# Patient Record
Sex: Male | Born: 1950 | Race: White | Hispanic: No | Marital: Married | Smoking: Never smoker
Health system: Southern US, Community
[De-identification: ages and names within clinical notes are randomized; demographics above are authoritative.]

## PROBLEM LIST (undated history)

## (undated) DIAGNOSIS — E119 Type 2 diabetes mellitus without complications: Secondary | ICD-10-CM

## (undated) DIAGNOSIS — I10 Essential (primary) hypertension: Secondary | ICD-10-CM

## (undated) DIAGNOSIS — I252 Old myocardial infarction: Secondary | ICD-10-CM

## (undated) HISTORY — PX: HERNIA REPAIR: SHX51

---

## 2015-09-13 ENCOUNTER — Emergency Department (HOSPITAL_BASED_OUTPATIENT_CLINIC_OR_DEPARTMENT_OTHER)
Admission: EM | Admit: 2015-09-13 | Discharge: 2015-09-13 | Disposition: A | Discharge status: Home / Self Care | Payer: Self-pay | Attending: Emergency Medicine | Admitting: Emergency Medicine

## 2015-09-13 ENCOUNTER — Encounter (HOSPITAL_BASED_OUTPATIENT_CLINIC_OR_DEPARTMENT_OTHER): Payer: Self-pay | Admitting: *Deleted

## 2015-09-13 ENCOUNTER — Emergency Department (HOSPITAL_BASED_OUTPATIENT_CLINIC_OR_DEPARTMENT_OTHER): Payer: Self-pay

## 2015-09-13 DIAGNOSIS — Y9259 Other trade areas as the place of occurrence of the external cause: Secondary | ICD-10-CM | POA: Insufficient documentation

## 2015-09-13 DIAGNOSIS — E119 Type 2 diabetes mellitus without complications: Secondary | ICD-10-CM | POA: Insufficient documentation

## 2015-09-13 DIAGNOSIS — Y9389 Activity, other specified: Secondary | ICD-10-CM | POA: Insufficient documentation

## 2015-09-13 DIAGNOSIS — S79911A Unspecified injury of right hip, initial encounter: Secondary | ICD-10-CM | POA: Insufficient documentation

## 2015-09-13 DIAGNOSIS — W010XXA Fall on same level from slipping, tripping and stumbling without subsequent striking against object, initial encounter: Secondary | ICD-10-CM | POA: Insufficient documentation

## 2015-09-13 DIAGNOSIS — S300XXA Contusion of lower back and pelvis, initial encounter: Secondary | ICD-10-CM | POA: Insufficient documentation

## 2015-09-13 DIAGNOSIS — I10 Essential (primary) hypertension: Secondary | ICD-10-CM | POA: Insufficient documentation

## 2015-09-13 DIAGNOSIS — Z794 Long term (current) use of insulin: Secondary | ICD-10-CM | POA: Insufficient documentation

## 2015-09-13 DIAGNOSIS — Y998 Other external cause status: Secondary | ICD-10-CM | POA: Insufficient documentation

## 2015-09-13 DIAGNOSIS — Z79899 Other long term (current) drug therapy: Secondary | ICD-10-CM | POA: Insufficient documentation

## 2015-09-13 HISTORY — DX: Type 2 diabetes mellitus without complications: E11.9

## 2015-09-13 HISTORY — DX: Essential (primary) hypertension: I10

## 2015-09-13 MED ORDER — TRAMADOL HCL 50 MG PO TABS: 50.0000 mg | ORAL_TABLET | Freq: Four times a day (QID) | ORAL | Status: AC | PRN

## 2015-09-13 MED ORDER — HYDROMORPHONE HCL 1 MG/ML IJ SOLN
1.0000 mg | Freq: Once | INTRAMUSCULAR | Status: AC
  Administered 2015-09-13: 1 mg via INTRAMUSCULAR
  Filled 2015-09-13: qty 1

## 2015-09-13 MED ORDER — KETOROLAC TROMETHAMINE 15 MG/ML IJ SOLN
15.0000 mg | Freq: Once | INTRAMUSCULAR | Status: AC
  Administered 2015-09-13: 15 mg via INTRAMUSCULAR
  Filled 2015-09-13: qty 1

## 2015-09-13 MED ORDER — DIAZEPAM 5 MG/ML IJ SOLN
5.0000 mg | Freq: Once | INTRAMUSCULAR | Status: AC
  Administered 2015-09-13: 5 mg via INTRAMUSCULAR
  Filled 2015-09-13: qty 2

## 2015-09-13 MED ORDER — CYCLOBENZAPRINE HCL 10 MG PO TABS: 10.0000 mg | ORAL_TABLET | Freq: Three times a day (TID) | ORAL | Status: AC | PRN

## 2015-09-13 MED ORDER — MORPHINE SULFATE (PF) 4 MG/ML IV SOLN
6.0000 mg | Freq: Once | INTRAVENOUS | Status: DC
Start: 2015-09-13 — End: 2015-09-13
  Filled 2015-09-13: qty 2

## 2015-09-13 NOTE — ED Notes (Signed)
Pt ambulated well with the walker with very little staff assistance.

## 2015-09-13 NOTE — ED Notes (Signed)
Pt ambulated to hallway and back to bed without difficulty with walker. Pt uses walker on a day to day basis at home. Tolerated well.

## 2015-09-13 NOTE — Discharge Instructions (Signed)

## 2015-09-13 NOTE — ED Notes (Signed)
C/o fall in laundry room of Hotel onto tailbone. C/o right hip, right shoulder and tailbone pain. No LOC.  Fell onto Community education officercement floor.

## 2015-09-13 NOTE — ED Notes (Signed)
[employer] called to transfer pt back to hotel. Staff do not have access to vehicle nor do they have authority to okay a staff member to transfer pt back to hotel.

## 2015-09-13 NOTE — ED Notes (Signed)
Dr Juleen ChinaKohut aware pt refused morphine.

## 2015-09-13 NOTE — ED Notes (Signed)
Talked with Terri RN charge and unit will give pt taxi voucher.

## 2015-09-24 ENCOUNTER — Encounter (HOSPITAL_COMMUNITY): Payer: Self-pay | Admitting: Emergency Medicine

## 2015-09-24 ENCOUNTER — Emergency Department (HOSPITAL_COMMUNITY)
Admission: EM | Admit: 2015-09-24 | Discharge: 2015-09-24 | Disposition: A | Discharge status: Home / Self Care | Payer: Self-pay | Attending: Emergency Medicine | Admitting: Emergency Medicine

## 2015-09-24 DIAGNOSIS — Y998 Other external cause status: Secondary | ICD-10-CM | POA: Insufficient documentation

## 2015-09-24 DIAGNOSIS — S3991XA Unspecified injury of abdomen, initial encounter: Secondary | ICD-10-CM | POA: Insufficient documentation

## 2015-09-24 DIAGNOSIS — E119 Type 2 diabetes mellitus without complications: Secondary | ICD-10-CM | POA: Insufficient documentation

## 2015-09-24 DIAGNOSIS — Y9289 Other specified places as the place of occurrence of the external cause: Secondary | ICD-10-CM | POA: Insufficient documentation

## 2015-09-24 DIAGNOSIS — W010XXA Fall on same level from slipping, tripping and stumbling without subsequent striking against object, initial encounter: Secondary | ICD-10-CM | POA: Insufficient documentation

## 2015-09-24 DIAGNOSIS — S8991XA Unspecified injury of right lower leg, initial encounter: Secondary | ICD-10-CM | POA: Insufficient documentation

## 2015-09-24 DIAGNOSIS — R1031 Right lower quadrant pain: Secondary | ICD-10-CM

## 2015-09-24 DIAGNOSIS — Z794 Long term (current) use of insulin: Secondary | ICD-10-CM | POA: Insufficient documentation

## 2015-09-24 DIAGNOSIS — Z79899 Other long term (current) drug therapy: Secondary | ICD-10-CM | POA: Insufficient documentation

## 2015-09-24 DIAGNOSIS — Y9389 Activity, other specified: Secondary | ICD-10-CM | POA: Insufficient documentation

## 2015-09-24 DIAGNOSIS — I10 Essential (primary) hypertension: Secondary | ICD-10-CM | POA: Insufficient documentation

## 2015-09-24 DIAGNOSIS — S79911A Unspecified injury of right hip, initial encounter: Secondary | ICD-10-CM | POA: Insufficient documentation

## 2015-09-24 MED ORDER — HYDROCODONE-ACETAMINOPHEN 5-325 MG PO TABS
1.0000 | ORAL_TABLET | Freq: Once | ORAL | Status: AC
  Administered 2015-09-24: 1 via ORAL
  Filled 2015-09-24: qty 1

## 2015-09-24 NOTE — ED Provider Notes (Signed)
History  By signing my name below, I, Timothy Walsh, attest that this documentation has been prepared under the direction and in the presence of [employer], PA-C. Electronically Signed: Karle Walsh, ED Scribe. 09/24/2015. 5:12 PM.  Chief Complaint  Patient presents with  . Fall  . Groin Pain   The history is provided by the patient and medical records. No language interpreter was used.    HPI Comments:  Timothy Walsh is a 65 y.o. male brought in by EMS, who presents to the Emergency Department complaining of a fall that occurred four days ago. He states he slipped on a wet spot in the floor and fell backwards on his back. He was taken to [facility] and received negative X-Rays. Pt reports receiving a prescription for pain medication but did not have it filled because it was too expensive. He states three days ago he started experiencing severe, burning, stabbing right groin pain after walking. He reports associated numbness of the right foot that is normal at baseline due to sciatica. He has been taking Tylenol 500 mg  Lying down intensifies the pain. Sitting up helps to relieve the pain. He denies abdominal pain, nausea, vomiting, fever or chills, urinary symptoms. Pt states he just moved here and does not have a local PCP.  Past Medical History  Diagnosis Date  . Hypertension   . Diabetes mellitus without complication (HCC)    History reviewed. No pertinent past surgical history. History reviewed. No pertinent family history. Social History  Substance Use Topics  . Smoking status: Never Smoker   . Smokeless tobacco: None  . Alcohol Use: None    Review of Systems A complete 10 system review of systems was obtained and all systems are negative except as noted in the HPI and PMH.   Allergies  Review of patient's allergies indicates no known allergies.  Home Medications   Prior to Admission medications   Medication Sig Start Date End Date Taking? Authorizing  Provider  acetaminophen (TYLENOL) 500 MG tablet Take 500 mg by mouth every 6 (six) hours as needed for moderate pain.   Yes Historical Provider, MD  atenolol (TENORMIN) 50 MG tablet Take 50 mg by mouth 2 (two) times daily.    Yes Historical Provider, MD  cyclobenzaprine (FLEXERIL) 10 MG tablet Take 1 tablet (10 mg total) by mouth 3 (three) times daily as needed for muscle spasms. 09/13/15   Raeford Razor, MD  insulin aspart (NOVOLOG) 100 UNIT/ML injection Inject 2 Units into the skin 3 (three) times daily before meals.    Historical Provider, MD  traMADol (ULTRAM) 50 MG tablet Take 1 tablet (50 mg total) by mouth every 6 (six) hours as needed. 09/13/15   Raeford Razor, MD   Triage Vitals: BP 168/109 mmHg  Pulse 100  Temp(Src) 97.6 F (36.4 C) (Oral)  Resp 18  SpO2 99% Physical Exam  Constitutional: He is oriented to person, place, and time. He appears well-developed and well-nourished. No distress.  HENT:  Head: Normocephalic and atraumatic.  Eyes: Conjunctivae and EOM are normal.  Neck: Normal range of motion.  Cardiovascular: Normal rate.   Pulmonary/Chest: Effort normal.  Abdominal: Soft. There is no tenderness. There is no rebound and no guarding.  Musculoskeletal: Normal range of motion. He exhibits tenderness.  Discomfort is replicated with right hip flexion. No hernia. No other swelling, erythema, warmth or other abnormalities.  Neurological: He is alert and oriented to person, place, and time.  Motor strength and sensation baseline.  Skin: Skin is warm and dry. He is not diaphoretic.  Psychiatric: He has a normal mood and affect. His behavior is normal.  Nursing note and vitals reviewed.   ED Course  Procedures (including critical care time) DIAGNOSTIC STUDIES: Oxygen Saturation is 99% on RA, normal by my interpretation.   COORDINATION OF CARE: 5:10 PM- Will prescribe a cheaper pain medication. Pt verbalizes understanding and agrees to plan.  Medications   HYDROcodone-acetaminophen (NORCO/VICODIN) 5-325 MG per tablet 1 tablet (1 tablet Oral Given 09/24/15 1734)    Labs Review Labs Reviewed - No data to display  Imaging Review No results found. I have personally reviewed and evaluated these images and lab results as part of my medical decision-making.   EKG Interpretation None     Meds given in ED:  Medications  HYDROcodone-acetaminophen (NORCO/VICODIN) 5-325 MG per tablet 1 tablet (1 tablet Oral Given 09/24/15 1734)    New Prescriptions   No medications on file   Filed Vitals:   09/24/15 1558 09/24/15 1638 09/24/15 1815  BP:  168/109 152/109  Pulse:  100 92  Temp:  97.6 F (36.4 C)   TempSrc:  Oral   Resp:  18 20  SpO2: 95% 99% 96%    MDM  Demarrio Menges is a 65 y.o. male who presents for evaluation of right leg and hip pain. Upon review of old notes, patient had x-rays done on 1/13 of bilateral hips, sacrum and lumbar spine which were all negative. He was discharged with a prescription for Flexeril and tramadol, which he did not fill stating it was too expensive (said $62). On exam today, pain is reproduced with right hip flexion. There is no evidence of hernia or other emergent pathology. Suspect musculoskeletal injury. Pain resolves with oral Norco in ED. No new injury or trauma, no further imaging indicated. Upon further investigation, both Flexeril tramadol are on the [facility] $4 list. Encouraged patient follow-up with [facility] pharmacy. He verbalizes understanding and agrees with this plan. Also given referral to community health and wellness in order to establish PCP care. Final diagnoses:  Right groin pain    I personally performed the services described in this documentation, which was scribed in my presence. The recorded information has been reviewed and is accurate.     Joycie Peek, PA-C 09/24/15 1835  Melene Plan, DO 09/24/15 2330

## 2015-09-24 NOTE — Discharge Instructions (Signed)
Please fill your prescriptions that you received at your previous visit. These medications are more inexpensive at [facility]. Follow up with the community health and wellness Center to establish primary care. Return to ED for any new or worsening symptoms.

## 2015-09-24 NOTE — ED Notes (Addendum)
Per EMS, Friday he fell and landed on sacrum, was seen and x-rayed then. Did not get pain medication filled and has been having increasing pain in his groin and tailbone area. Hx of sciatica, low back and leg pain. Denies any swelling, redness, deformities near areas. Denies pain to genitals.

## 2015-09-24 NOTE — ED Notes (Signed)
Pt is waiting for Social Worker to assist with transportation

## 2015-09-25 NOTE — ED Provider Notes (Signed)
CSN: [id number]     Arrival date & time 09/13/15  4098 History   First MD Initiated Contact with Patient 09/13/15 [phone]     Chief Complaint  Patient presents with  . Fall  . Hip Pain     (Consider location/radiation/quality/duration/timing/severity/associated sxs/prior Treatment) HPI   65 year old male with lower back/buttock pain. Patient reports mechanical fall when he slipped on a wet surface at a local hotel just prior to arrival. He fell backwards onto his butt/back. He has had persistent lower back/sacral pain since then. Pain is worse with movement. No numbness or tingling. He is not sure if he hit his head or not. Denies any significant headaches or neck pain. No blood thinners.  Past Medical History  Diagnosis Date  . Hypertension   . Diabetes mellitus without complication (HCC)    History reviewed. No pertinent past surgical history. No family history on file. Social History  Substance Use Topics  . Smoking status: Never Smoker   . Smokeless tobacco: None  . Alcohol Use: None    Review of Systems  All systems reviewed and negative, other than as noted in HPI.   Allergies  Review of patient's allergies indicates no known allergies.  Home Medications   Prior to Admission medications   Medication Sig Start Date End Date Taking? Authorizing Provider  acetaminophen (TYLENOL) 500 MG tablet Take 500 mg by mouth every 6 (six) hours as needed for moderate pain.    Historical Provider, MD  atenolol (TENORMIN) 50 MG tablet Take 50 mg by mouth 2 (two) times daily.    Yes Historical Provider, MD  cyclobenzaprine (FLEXERIL) 10 MG tablet Take 1 tablet (10 mg total) by mouth 3 (three) times daily as needed for muscle spasms. 09/13/15   Raeford Razor, MD  insulin aspart (NOVOLOG) 100 UNIT/ML injection Inject 2 Units into the skin 3 (three) times daily before meals.   Yes Historical Provider, MD  traMADol (ULTRAM) 50 MG tablet Take 1 tablet (50 mg total) by mouth every 6 (six)  hours as needed. 09/13/15   Raeford Razor, MD   BP 171/105 mmHg  Pulse 81  Temp(Src) 98.2 F (36.8 C) (Oral)  Resp 18  Ht  (1.753 m)  Wt 221 lb (100.245 kg)  BMI 32.62 kg/m2  SpO2 98% Physical Exam  Constitutional: He appears well-developed and well-nourished. No distress.  HENT:  Head: Normocephalic and atraumatic.  Eyes: Conjunctivae are normal. Right eye exhibits no discharge. Left eye exhibits no discharge.  Neck: Neck supple.  Cardiovascular: Normal rate, regular rhythm and normal heart sounds.  Exam reveals no gallop and no friction rub.   No murmur heard. Pulmonary/Chest: Effort normal and breath sounds normal. No respiratory distress.  Abdominal: Soft. He exhibits no distension. There is no tenderness.  Musculoskeletal: He exhibits no edema or tenderness.  Tenderness to palpation in the lower lumbar region and sacrum. No overlying skin changes. Lower extremity*surgery as compared to each other. No shortening or malrotation. He is neurovascularly intact. He has no bony tenderness of the lower extremities or pelvis. He does report increased pain with range of motion of either hip though.  Neurological: He is alert.  Skin: Skin is warm and dry.  Psychiatric: He has a normal mood and affect. His behavior is normal. Thought content normal.  Nursing note and vitals reviewed.   ED Course  Procedures (including critical care time) Labs Review Labs Reviewed - No data to display  Imaging Review No results found. I have personally  reviewed and evaluated these images and lab results as part of my medical decision-making.   EKG Interpretation None      MDM   Final diagnoses:  Contusion of lower back, initial encounter    65 year old male with lower back/sacral pain after a mechanical fall this prior to arrival. Nonfocal neuro exam. Reassuring imaging. Likely contusion and plan symptomatic treatment.    Raeford Razor, MD 09/25/15 [phone]

## 2017-11-21 ENCOUNTER — Other Ambulatory Visit: Payer: Self-pay

## 2017-11-21 ENCOUNTER — Emergency Department (HOSPITAL_COMMUNITY)
Admission: EM | Admit: 2017-11-21 | Discharge: 2017-11-22 | Disposition: A | Discharge status: Home / Self Care | Payer: Self-pay | Attending: Emergency Medicine | Admitting: Emergency Medicine

## 2017-11-21 ENCOUNTER — Encounter (HOSPITAL_COMMUNITY): Payer: Self-pay

## 2017-11-21 DIAGNOSIS — I82401 Acute embolism and thrombosis of unspecified deep veins of right lower extremity: Secondary | ICD-10-CM | POA: Insufficient documentation

## 2017-11-21 DIAGNOSIS — R739 Hyperglycemia, unspecified: Secondary | ICD-10-CM

## 2017-11-21 DIAGNOSIS — E1165 Type 2 diabetes mellitus with hyperglycemia: Secondary | ICD-10-CM | POA: Insufficient documentation

## 2017-11-21 DIAGNOSIS — I10 Essential (primary) hypertension: Secondary | ICD-10-CM | POA: Insufficient documentation

## 2017-11-21 DIAGNOSIS — I252 Old myocardial infarction: Secondary | ICD-10-CM | POA: Insufficient documentation

## 2017-11-21 HISTORY — DX: Old myocardial infarction: I25.2

## 2017-11-21 LAB — CBC WITH DIFFERENTIAL/PLATELET
BASOS PCT: 0 %
Basophils Absolute: 0 10*3/uL (ref 0.0–0.1)
EOS ABS: 0.1 10*3/uL (ref 0.0–0.7)
Eosinophils Relative: 2 %
HEMATOCRIT: 39.3 % (ref 39.0–52.0)
HEMOGLOBIN: 13.1 g/dL (ref 13.0–17.0)
Lymphocytes Relative: 17 %
Lymphs Abs: 1.6 10*3/uL (ref 0.7–4.0)
MCH: 29.2 pg (ref 26.0–34.0)
MCHC: 33.3 g/dL (ref 30.0–36.0)
MCV: 87.7 fL (ref 78.0–100.0)
MONOS PCT: 10 %
Monocytes Absolute: 0.9 10*3/uL (ref 0.1–1.0)
NEUTROS ABS: 6.7 10*3/uL (ref 1.7–7.7)
NEUTROS PCT: 71 %
Platelets: 229 10*3/uL (ref 150–400)
RBC: 4.48 MIL/uL (ref 4.22–5.81)
RDW: 13.5 % (ref 11.5–15.5)
WBC: 9.4 10*3/uL (ref 4.0–10.5)

## 2017-11-21 LAB — BLOOD GAS, VENOUS
Acid-Base Excess: 0.6 mmol/L (ref 0.0–2.0)
Bicarbonate: 25.2 mmol/L (ref 20.0–28.0)
O2 SAT: 25 %
PATIENT TEMPERATURE: 98.6
PH VEN: 7.388 (ref 7.250–7.430)
pCO2, Ven: 42.8 mmHg — ABNORMAL LOW (ref 44.0–60.0)

## 2017-11-21 LAB — CBG MONITORING, ED: Glucose-Capillary: 512 mg/dL (ref 65–99)

## 2017-11-21 LAB — URINALYSIS, ROUTINE W REFLEX MICROSCOPIC
BACTERIA UA: NONE SEEN
Bilirubin Urine: NEGATIVE
Ketones, ur: NEGATIVE mg/dL
LEUKOCYTES UA: NEGATIVE
NITRITE: NEGATIVE
PROTEIN: NEGATIVE mg/dL
Specific Gravity, Urine: 1.02 (ref 1.005–1.030)
pH: 5 (ref 5.0–8.0)

## 2017-11-21 LAB — PROTIME-INR
INR: 0.92
PROTHROMBIN TIME: 12.3 s (ref 11.4–15.2)

## 2017-11-21 NOTE — ED Notes (Signed)
Bed: [id number] Expected date:  Expected time:  Means of arrival:  Comments: 68 m leg pain/hyperglycemia

## 2017-11-21 NOTE — ED Triage Notes (Signed)
Right leg pain for 3 days and blood sugar 561 states pain 9/10.

## 2017-11-21 NOTE — ED Provider Notes (Signed)
[facility]-EMERGENCY DEPT Provider Note   CSN: [id number] Arrival date & time: 11/21/17  2155     History   Chief Complaint Chief Complaint  Patient presents with  . Leg Pain    HPI Timothy Walsh is a 67 y.o. male with a hx of IDDM, HTN, MI presents to the Emergency Department complaining of gradual, persistent, progressively worsening right leg pain onset 3 days ago. Associated symptoms include of the right lower extremity.  Patient denies recent surgery, fracture, immobilization, history of DVT.  Patient reports pain is significantly increased with palpation and walking.  He denies fevers or chills, open wounds.  Nothing seems to make the symptoms better.  Additionally, patient reports hyperglycemia tonight.  He reports that when the ambulance checked his blood sugar it was greater than 500.  He reports he is not taking insulin but is only taking metformin.  He states his blood sugar normally runs 200-300 at home.  Patient denies fever, chills, headache, neck pain, chest pain, shortness of breath, abdominal pain, nausea, vomiting, diarrhea, weakness, dizziness, syncope.  He does endorse polydipsia and polyuria.  The history is provided by the patient, medical records and the spouse. No language interpreter was used.    Past Medical History:  Diagnosis Date  . Diabetes mellitus without complication (HCC)   . Hypertension   . MI, old     There are no active problems to display for this patient.   History reviewed. No pertinent surgical history.      Home Medications    Prior to Admission medications   Medication Sig Start Date End Date Taking? Authorizing Provider  cyclobenzaprine (FLEXERIL) 10 MG tablet Take 1 tablet (10 mg total) by mouth 3 (three) times daily as needed for muscle spasms. 09/13/15  Yes Raeford RazorKohut, Stephen, MD  ibuprofen (ADVIL,MOTRIN) 200 MG tablet Take 200 mg by mouth every 6 (six) hours as needed for headache, mild pain or moderate pain.    Yes [provider]  traMADol (ULTRAM) 50 MG tablet Take 1 tablet (50 mg total) by mouth every 6 (six) hours as needed. Patient not taking: Reported on 11/22/2017 09/13/15   Raeford RazorKohut, Stephen, MD    Family History History reviewed. No pertinent family history.  Social History Social History   Tobacco Use  . Smoking status: Never Smoker  . Smokeless tobacco: Never Used  Substance Use Topics  . Alcohol use: Not on file  . Drug use: Not on file     Allergies   Patient has no known allergies.   Review of Systems Review of Systems  Constitutional: Negative for appetite change, diaphoresis, fatigue, fever and unexpected weight change.  HENT: Negative for mouth sores.   Eyes: Negative for visual disturbance.  Respiratory: Negative for cough, chest tightness, shortness of breath and wheezing.   Cardiovascular: Positive for leg swelling. Negative for chest pain.  Gastrointestinal: Negative for abdominal pain, constipation, diarrhea, nausea and vomiting.  Endocrine: Positive for polydipsia and polyuria. Negative for polyphagia.  Genitourinary: Negative for dysuria, frequency, hematuria and urgency.  Musculoskeletal: Positive for arthralgias. Negative for back pain and neck stiffness.  Skin: Positive for color change. Negative for rash.  Allergic/Immunologic: Negative for immunocompromised state.  Neurological: Negative for syncope, light-headedness and headaches.  Hematological: Does not bruise/bleed easily.  Psychiatric/Behavioral: Negative for sleep disturbance. The patient is not nervous/anxious.      Physical Exam Updated Vital Signs BP (!) 163/85 (BP Location: Left Arm)   Pulse 91   Temp 97.7  F (36.5 C) (Oral)   Resp 18   Ht 6' (1.829 m)   Wt 100.2 kg (221 lb)   SpO2 98%   BMI 29.97 kg/m   Physical Exam  Constitutional: He appears well-developed and well-nourished. No distress.  Awake, alert, nontoxic appearance  HENT:  Head: Normocephalic and atraumatic.    Mouth/Throat: Oropharynx is clear and moist. Mucous membranes are dry. No oropharyngeal exudate.  Mucous membranes are dry  Eyes: Conjunctivae are normal. No scleral icterus.  Neck: Normal range of motion. Neck supple.  Cardiovascular: Normal rate, regular rhythm and intact distal pulses.  Pulmonary/Chest: Effort normal and breath sounds normal. No respiratory distress. He has no wheezes.  Equal chest expansion  Abdominal: Soft. Bowel sounds are normal. He exhibits no mass. There is no tenderness. There is no rebound and no guarding.  Musculoskeletal: Normal range of motion. He exhibits no edema.  Right lower extremity with significant tenderness to palpation along the right posterior calf.  Entire right lower extremity is more edematous than left lower extremity.  No significant increased warmth, induration or fluctuance to suggest cellulitis.  Neurological: He is alert.  Speech is clear and goal oriented Moves extremities without ataxia  Skin: Skin is warm and dry. He is not diaphoretic.  Psychiatric: He has a normal mood and affect.  Nursing note and vitals reviewed.    ED Treatments / Results  Labs (all labs ordered are listed, but only abnormal results are displayed) Labs Reviewed  COMPREHENSIVE METABOLIC PANEL - Abnormal; Notable for the following components:      Result Value   Sodium 133 (*)    Chloride 98 (*)    Glucose, Bld 508 (*)    BUN 29 (*)    Creatinine, Ser 1.49 (*)    AST 14 (*)    ALT 16 (*)    GFR calc non Af Amer 48 (*)    GFR calc Af Amer 55 (*)    All other components within normal limits  URINALYSIS, ROUTINE W REFLEX MICROSCOPIC - Abnormal; Notable for the following components:   Color, Urine STRAW (*)    Glucose, UA >=500 (*)    Hgb urine dipstick SMALL (*)    Squamous Epithelial / LPF 0-5 (*)    All other components within normal limits  BLOOD GAS, VENOUS - Abnormal; Notable for the following components:   pCO2, Ven 42.8 (*)    All other  components within normal limits  CBG MONITORING, ED - Abnormal; Notable for the following components:   Glucose-Capillary 512 (*)    All other components within normal limits  CBG MONITORING, ED - Abnormal; Notable for the following components:   Glucose-Capillary 431 (*)    All other components within normal limits  CBG MONITORING, ED - Abnormal; Notable for the following components:   Glucose-Capillary 429 (*)    All other components within normal limits  CBG MONITORING, ED - Abnormal; Notable for the following components:   Glucose-Capillary 356 (*)    All other components within normal limits  CBC WITH DIFFERENTIAL/PLATELET  PROTIME-INR    EKG EKG Interpretation  Date/Time:  Sunday November 21 2017 23:26:00 EDT Ventricular Rate:  87 PR Interval:    QRS Duration: 132 QT Interval:  387 QTC Calculation: 466 R Axis:   69 Text Interpretation:  Sinus rhythm Right bundle branch block No old tracing to compare Confirmed by Melene Plan [phone]) on 11/21/2017 11:29:25 PM   Procedures Procedures (including critical care time)  Medications  Ordered in ED Medications  sodium chloride 0.9 % bolus 1,000 mL (0 mLs Intravenous Stopped 11/22/17 0239)  sodium chloride 0.9 % bolus 500 mL (0 mLs Intravenous Stopped 11/22/17 0318)  enoxaparin (LOVENOX) injection 100 mg (100 mg Subcutaneous Given 11/22/17 0301)  sodium chloride 0.9 % bolus 500 mL (0 mLs Intravenous Stopped 11/22/17 0355)  HYDROcodone-acetaminophen (NORCO/VICODIN) 5-325 MG per tablet 1 tablet (1 tablet Oral Given 11/22/17 0319)     Initial Impression / Assessment and Plan / ED Course  I have reviewed the triage vital signs and the nursing notes.  Pertinent labs & imaging results that were available during my care of the patient were reviewed by me and considered in my medical decision making (see chart for details).  Clinical Course as of Nov 23 623  Wynelle Link Nov 21, 2017  2345 Glucose(!!): 508 [HM]  2345 No ketones in his urine    Ketones, ur: NEGATIVE [HM]  2345 Elevated, unknown baseline.  Creatinine(!): 1.49 [HM]  2345 PH within normal limits  pH, Ven: 7.388 [HM]  2345 No leukocytosis  WBC: 9.4 [HM]  Mon Nov 22, 2017  0130 Somewhat improved blood sugar.  Fluids running at this time.  Glucose-Capillary(!): 431 [HM]  0220 Pt hypertensive.  Denies chest pain or shortness of breath.  BP(!): 171/96 [HM]  0541 Blood sugar has improved.  Glucose-Capillary(!): 356 [HM]    Clinical Course User Index [HM] Zahari Fazzino, Dahlia Client, PA-C    Patient presents with hyperglycemia and concern for possible right lower extremity DVT.  Patient with hyperglycemia.  No evidence of DKA.  He is hypertensive but has no pain or shortness of breath.  Serum creatinine is elevated however unknown baseline.  No ketones in his urine.  Patient is receiving fluids at this time.  He will need venous duplex in the a.m. patient given Lovenox here in the emergency department.  5:47 AM Patient's blood sugar has decreased.  No evidence of DKA.  No emesis.  He denies chest pain or shortness of breath.  No hypoxia.  No tachycardia.  No hemoptysis.  He will receive venous duplex in the a.m.  6:25 AM At shift change care was transferred to Timothy Robinson, PA-C who will follow pending studies, re-evaulate and determine disposition.     Final Clinical Impressions(s) / ED Diagnoses   Final diagnoses:  Hyperglycemia  Right leg pain    ED Discharge Orders    None       Derel Mcglasson, Boyd Kerbs 11/22/17 5366    Melene Plan, DO 11/30/17 1512

## 2017-11-22 ENCOUNTER — Emergency Department (HOSPITAL_BASED_OUTPATIENT_CLINIC_OR_DEPARTMENT_OTHER)
Admit: 2017-11-22 | Discharge: 2017-11-22 | Disposition: A | Discharge status: Home / Self Care | Payer: Self-pay | Attending: Emergency Medicine | Admitting: Emergency Medicine

## 2017-11-22 ENCOUNTER — Telehealth: Payer: Self-pay | Admitting: Emergency Medicine

## 2017-11-22 DIAGNOSIS — R609 Edema, unspecified: Secondary | ICD-10-CM

## 2017-11-22 LAB — CBG MONITORING, ED
GLUCOSE-CAPILLARY: 431 mg/dL — AB (ref 65–99)
Glucose-Capillary: 429 mg/dL — ABNORMAL HIGH (ref 65–99)
Glucose-Capillary: 356 mg/dL — ABNORMAL HIGH (ref 65–99)

## 2017-11-22 LAB — COMPREHENSIVE METABOLIC PANEL
ALBUMIN: 3.5 g/dL (ref 3.5–5.0)
ALT: 16 U/L — AB (ref 17–63)
AST: 14 U/L — AB (ref 15–41)
Alkaline Phosphatase: 81 U/L (ref 38–126)
Anion gap: 12 (ref 5–15)
BUN: 29 mg/dL — AB (ref 6–20)
CHLORIDE: 98 mmol/L — AB (ref 101–111)
CO2: 23 mmol/L (ref 22–32)
Calcium: 9.2 mg/dL (ref 8.9–10.3)
Creatinine, Ser: 1.49 mg/dL — ABNORMAL HIGH (ref 0.61–1.24)
GFR calc Af Amer: 55 mL/min — ABNORMAL LOW (ref >60)
GFR, EST NON AFRICAN AMERICAN: 48 mL/min — AB (ref >60)
Glucose, Bld: 508 mg/dL (ref 65–99)
POTASSIUM: 3.8 mmol/L (ref 3.5–5.1)
SODIUM: 133 mmol/L — AB (ref 135–145)
Total Bilirubin: 0.4 mg/dL (ref 0.3–1.2)
Total Protein: 7.6 g/dL (ref 6.5–8.1)

## 2017-11-22 MED ORDER — RIVAROXABAN (XARELTO) VTE STARTER PACK (15 & 20 MG): ORAL_TABLET | ORAL | 0 refills | Status: DC

## 2017-11-22 MED ORDER — ENOXAPARIN SODIUM 100 MG/ML ~~LOC~~ SOLN
1.0000 mg/kg | Freq: Once | SUBCUTANEOUS | Status: AC
  Administered 2017-11-22: 100 mg via SUBCUTANEOUS
  Filled 2017-11-22: qty 1

## 2017-11-22 MED ORDER — SODIUM CHLORIDE 0.9 % IV BOLUS (SEPSIS)
1000.0000 mL | Freq: Once | INTRAVENOUS | Status: AC
  Administered 2017-11-22: 1000 mL via INTRAVENOUS

## 2017-11-22 MED ORDER — SODIUM CHLORIDE 0.9 % IV BOLUS (SEPSIS)
500.0000 mL | Freq: Once | INTRAVENOUS | Status: AC
  Administered 2017-11-22: 500 mL via INTRAVENOUS

## 2017-11-22 MED ORDER — SODIUM CHLORIDE 0.9 % IV BOLUS (SEPSIS)
500.0000 mL | Freq: Once | INTRAVENOUS | Status: AC
Start: 2017-11-22 — End: 2017-11-22
  Administered 2017-11-22: 500 mL via INTRAVENOUS

## 2017-11-22 MED ORDER — HYDROCODONE-ACETAMINOPHEN 5-325 MG PO TABS
1.0000 | ORAL_TABLET | Freq: Once | ORAL | Status: AC
  Administered 2017-11-22: 1 via ORAL
  Filled 2017-11-22: qty 1

## 2017-11-22 MED ORDER — METFORMIN HCL 500 MG PO TABS: 500.0000 mg | ORAL_TABLET | Freq: Two times a day (BID) | ORAL | 0 refills | Status: DC

## 2017-11-22 NOTE — ED Notes (Signed)
Vascular at the bedside. 

## 2017-11-22 NOTE — ED Notes (Signed)
Condom cath placed due to pt urinary frequency

## 2017-11-22 NOTE — Discharge Instructions (Signed)
Please read instructions below. Begin taking the xarelto every 12 hours, as directed. Schedule an appointment with the vascular specialist, Dr. Arbie CookeyEarly, to follow up on your blood clot treatment. You can take tylenol as needed for pain. Establish primary care. Take your metformin as prescribed. Drink plenty of water. Return to the ER for shortness of breath, chest pain, coughing up blood, or new or concerning symptoms.

## 2017-11-22 NOTE — Progress Notes (Signed)
Right lower extremity venous duplex has been completed. 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 There is also evidence of age indeterminate superficial vein thrombosis involving the lesser saphenous vein of the right lower extremity. Results were given to [place] Robinson PA.  11/22/17 8:52 AM Olen CordialGreg Ardian Haberland RVT

## 2017-11-22 NOTE — Telephone Encounter (Signed)
CM contacted by [place] EDPA regarding the pt who was previously D/C with a new prescription for Xarelto.  Pt had called in [facility].  CM received the pharmacy contact information, [phone].  Advised them that if the 30-day free trial card does not work that pt will need [facility].  CM faxed card to [phone].  [facility] in case [facility] decides to establish care there.  CM noted [facility] information was placed on AVS before D/C.  No further CM needs noted at this time.

## 2017-11-22 NOTE — ED Provider Notes (Signed)
Care assumed at shift change from [facility] Muthersbaugh, PA-C, pending venous U/S. See her note for full HPI and workup. Briefly, pt presenting with RLE pain x 3 days. Suspected DVT, pending venous U/S. Pt also with hyperglycemia, treated in ED, no evidence of DKA.  Lovenox administered. No signs of PE. If positive for DVT, d/c on lovenox with PCP follow up. Anticipated d/c.  Physical Exam  BP (!) 152/92   Pulse 76   Temp 97.7 F (36.5 C) (Oral)   Resp 18   Ht 6' (1.829 m)   Wt 100.2 kg (221 lb)   SpO2 94%   BMI 29.97 kg/m   Physical Exam  Constitutional: He appears well-developed and well-nourished. No distress.  Well-appearing, not in distress.  Normal work of breathing.  HENT:  Head: Normocephalic and atraumatic.  Eyes: Conjunctivae are normal.  Cardiovascular: Normal rate.  Pulmonary/Chest: Effort normal. No respiratory distress.  Musculoskeletal:  Right leg with diffuse swelling and tenderness.  Tenderness is worse over right posterior calf.  Intact distal pulses.  Psychiatric: He has a normal mood and affect. His behavior is normal.  Nursing note and vitals reviewed.   ED Course/Procedures   Clinical Course as of Nov 23 1127  Sun Nov 21, 2017  2345 Glucose(!!): 508 [HM]  2345 No ketones in his urine  Ketones, ur: NEGATIVE [HM]  2345 Elevated, unknown baseline.  Creatinine(!): 1.49 [HM]  2345 PH within normal limits  pH, Ven: 7.388 [HM]  2345 No leukocytosis  WBC: 9.4 [HM]  Mon Nov 22, 2017  0130 Somewhat improved blood sugar.  Fluids running at this time.  Glucose-Capillary(!): 431 [HM]  0220 Pt hypertensive.  Denies chest pain or shortness of breath.  BP(!): 171/96 [HM]  0541 Blood sugar has improved.  Glucose-Capillary(!): 356 [HM]  [id number] Vascular reporting U/S positive for DVT   [JR]    Clinical Course User Index [HM] Muthersbaugh, Dahlia ClientHannah, PA-C [JR] Yisrael Obryan, [place] N, PA-C    Procedures  MDM  Ultrasound positive for right lower extremity DVT and SVT.   Dose of Lovenox administered in the ED.  On reevaluation, patient continues to deny respiratory symptoms.  Vital signs are stable.  Discussed importance of anticoagulation.  Will prescribe Xarelto, and provide vascular referral for follow-up as patient does not have PCP at this time.  PCP referral also given.  Strict return precautions discussed, and patient verbalized understanding.  Safe for discharge.  Discussed results, findings, treatment and follow up. Patient advised of return precautions. Patient verbalized understanding and agreed with plan.     Ruthmary Occhipinti, [place] N, PA-C 11/22/17 1132    Dione BoozeGlick, David, MD 11/22/17 2248

## 2017-11-24 ENCOUNTER — Telehealth: Payer: Self-pay

## 2017-11-24 NOTE — Telephone Encounter (Signed)
Message received from Eldridge AbrahamsAngela Kritzer, RN CM noting that the patient may contact [facility] to establish care. No call received as of now.  No hospital follow up appointments available.  Update provided to Breck CoonsA. Kritzer, RN CM

## 2018-11-03 ENCOUNTER — Other Ambulatory Visit: Payer: Self-pay

## 2018-11-03 ENCOUNTER — Inpatient Hospital Stay (HOSPITAL_COMMUNITY)
Admission: EM | Admit: 2018-11-03 | Discharge: 2018-11-11 | DRG: 853 | Disposition: A | Discharge status: Home / Self Care | Payer: Medicare Other | Attending: Internal Medicine | Admitting: Internal Medicine

## 2018-11-03 ENCOUNTER — Encounter (HOSPITAL_COMMUNITY): Payer: Self-pay

## 2018-11-03 ENCOUNTER — Emergency Department (HOSPITAL_COMMUNITY): Payer: Medicare Other

## 2018-11-03 DIAGNOSIS — D62 Acute posthemorrhagic anemia: Secondary | ICD-10-CM | POA: Diagnosis present

## 2018-11-03 DIAGNOSIS — N320 Bladder-neck obstruction: Secondary | ICD-10-CM | POA: Diagnosis present

## 2018-11-03 DIAGNOSIS — N136 Pyonephrosis: Secondary | ICD-10-CM | POA: Diagnosis present

## 2018-11-03 DIAGNOSIS — I2510 Atherosclerotic heart disease of native coronary artery without angina pectoris: Secondary | ICD-10-CM | POA: Diagnosis present

## 2018-11-03 DIAGNOSIS — R319 Hematuria, unspecified: Secondary | ICD-10-CM | POA: Diagnosis present

## 2018-11-03 DIAGNOSIS — R32 Unspecified urinary incontinence: Secondary | ICD-10-CM | POA: Diagnosis present

## 2018-11-03 DIAGNOSIS — N1330 Unspecified hydronephrosis: Secondary | ICD-10-CM | POA: Diagnosis present

## 2018-11-03 DIAGNOSIS — Z79891 Long term (current) use of opiate analgesic: Secondary | ICD-10-CM | POA: Diagnosis not present

## 2018-11-03 DIAGNOSIS — N401 Enlarged prostate with lower urinary tract symptoms: Secondary | ICD-10-CM | POA: Diagnosis present

## 2018-11-03 DIAGNOSIS — R310 Gross hematuria: Secondary | ICD-10-CM | POA: Diagnosis present

## 2018-11-03 DIAGNOSIS — A419 Sepsis, unspecified organism: Secondary | ICD-10-CM | POA: Diagnosis present

## 2018-11-03 DIAGNOSIS — B9561 Methicillin susceptible Staphylococcus aureus infection as the cause of diseases classified elsewhere: Secondary | ICD-10-CM | POA: Diagnosis present

## 2018-11-03 DIAGNOSIS — Z79899 Other long term (current) drug therapy: Secondary | ICD-10-CM | POA: Diagnosis not present

## 2018-11-03 DIAGNOSIS — E1169 Type 2 diabetes mellitus with other specified complication: Secondary | ICD-10-CM

## 2018-11-03 DIAGNOSIS — I252 Old myocardial infarction: Secondary | ICD-10-CM | POA: Diagnosis not present

## 2018-11-03 DIAGNOSIS — Z86718 Personal history of other venous thrombosis and embolism: Secondary | ICD-10-CM | POA: Diagnosis not present

## 2018-11-03 DIAGNOSIS — I10 Essential (primary) hypertension: Secondary | ICD-10-CM | POA: Diagnosis present

## 2018-11-03 DIAGNOSIS — Z7982 Long term (current) use of aspirin: Secondary | ICD-10-CM

## 2018-11-03 DIAGNOSIS — R3911 Hesitancy of micturition: Secondary | ICD-10-CM | POA: Diagnosis present

## 2018-11-03 DIAGNOSIS — Z7901 Long term (current) use of anticoagulants: Secondary | ICD-10-CM

## 2018-11-03 DIAGNOSIS — N390 Urinary tract infection, site not specified: Secondary | ICD-10-CM

## 2018-11-03 DIAGNOSIS — Z7984 Long term (current) use of oral hypoglycemic drugs: Secondary | ICD-10-CM

## 2018-11-03 DIAGNOSIS — E871 Hypo-osmolality and hyponatremia: Secondary | ICD-10-CM | POA: Diagnosis present

## 2018-11-03 DIAGNOSIS — N210 Calculus in bladder: Secondary | ICD-10-CM | POA: Diagnosis present

## 2018-11-03 DIAGNOSIS — Z23 Encounter for immunization: Secondary | ICD-10-CM

## 2018-11-03 DIAGNOSIS — E1110 Type 2 diabetes mellitus with ketoacidosis without coma: Secondary | ICD-10-CM | POA: Diagnosis present

## 2018-11-03 DIAGNOSIS — R311 Benign essential microscopic hematuria: Secondary | ICD-10-CM

## 2018-11-03 DIAGNOSIS — E119 Type 2 diabetes mellitus without complications: Secondary | ICD-10-CM

## 2018-11-03 LAB — BASIC METABOLIC PANEL
ANION GAP: 12 (ref 5–15)
Anion gap: 6 (ref 5–15)
Anion gap: 5 (ref 5–15)
Anion gap: 6 (ref 5–15)
Anion gap: 6 (ref 5–15)
BUN: 21 mg/dL (ref 8–23)
BUN: 20 mg/dL (ref 8–23)
BUN: 16 mg/dL (ref 8–23)
BUN: 16 mg/dL (ref 8–23)
BUN: 15 mg/dL (ref 8–23)
CHLORIDE: 100 mmol/L (ref 98–111)
CO2: 20 mmol/L — ABNORMAL LOW (ref 22–32)
CO2: 22 mmol/L (ref 22–32)
CO2: 19 mmol/L — AB (ref 22–32)
CO2: 19 mmol/L — ABNORMAL LOW (ref 22–32)
CO2: 19 mmol/L — ABNORMAL LOW (ref 22–32)
Calcium: 8.7 mg/dL — ABNORMAL LOW (ref 8.9–10.3)
Calcium: 7.8 mg/dL — ABNORMAL LOW (ref 8.9–10.3)
Calcium: 6.9 mg/dL — ABNORMAL LOW (ref 8.9–10.3)
Calcium: 7.2 mg/dL — ABNORMAL LOW (ref 8.9–10.3)
Calcium: 7.1 mg/dL — ABNORMAL LOW (ref 8.9–10.3)
Chloride: 108 mmol/L (ref 98–111)
Chloride: 112 mmol/L — ABNORMAL HIGH (ref 98–111)
Chloride: 113 mmol/L — ABNORMAL HIGH (ref 98–111)
Chloride: 111 mmol/L (ref 98–111)
Creatinine, Ser: 1.56 mg/dL — ABNORMAL HIGH (ref 0.61–1.24)
Creatinine, Ser: 1.38 mg/dL — ABNORMAL HIGH (ref 0.61–1.24)
Creatinine, Ser: 1.27 mg/dL — ABNORMAL HIGH (ref 0.61–1.24)
Creatinine, Ser: 1.23 mg/dL (ref 0.61–1.24)
Creatinine, Ser: 1.33 mg/dL — ABNORMAL HIGH (ref 0.61–1.24)
GFR calc Af Amer: >60 mL/min (ref >60)
GFR calc Af Amer: >60 mL/min (ref >60)
GFR calc Af Amer: >60 mL/min (ref >60)
GFR calc non Af Amer: 46 mL/min — ABNORMAL LOW (ref >60)
GFR calc non Af Amer: 53 mL/min — ABNORMAL LOW (ref >60)
GFR calc non Af Amer: 58 mL/min — ABNORMAL LOW (ref >60)
GFR calc non Af Amer: >60 mL/min (ref >60)
GFR calc non Af Amer: 55 mL/min — ABNORMAL LOW (ref >60)
GFR, EST AFRICAN AMERICAN: 53 mL/min — AB (ref >60)
Glucose, Bld: 451 mg/dL — ABNORMAL HIGH (ref 70–99)
Glucose, Bld: 241 mg/dL — ABNORMAL HIGH (ref 70–99)
Glucose, Bld: 248 mg/dL — ABNORMAL HIGH (ref 70–99)
Glucose, Bld: 238 mg/dL — ABNORMAL HIGH (ref 70–99)
Glucose, Bld: 236 mg/dL — ABNORMAL HIGH (ref 70–99)
POTASSIUM: 4 mmol/L (ref 3.5–5.1)
POTASSIUM: 3.7 mmol/L (ref 3.5–5.1)
Potassium: 3.9 mmol/L (ref 3.5–5.1)
Potassium: 3.8 mmol/L (ref 3.5–5.1)
Potassium: 3.6 mmol/L (ref 3.5–5.1)
Sodium: 132 mmol/L — ABNORMAL LOW (ref 135–145)
Sodium: 136 mmol/L (ref 135–145)
Sodium: 136 mmol/L (ref 135–145)
Sodium: 138 mmol/L (ref 135–145)
Sodium: 136 mmol/L (ref 135–145)

## 2018-11-03 LAB — CBC WITH DIFFERENTIAL/PLATELET
Abs Immature Granulocytes: 0.08 10*3/uL — ABNORMAL HIGH (ref 0.00–0.07)
Basophils Absolute: 0 10*3/uL (ref 0.0–0.1)
Basophils Relative: 0 %
Eosinophils Absolute: 0 10*3/uL (ref 0.0–0.5)
Eosinophils Relative: 0 %
HEMATOCRIT: 47.1 % (ref 39.0–52.0)
HEMOGLOBIN: 15.2 g/dL (ref 13.0–17.0)
IMMATURE GRANULOCYTES: 1 %
LYMPHS ABS: 0.7 10*3/uL (ref 0.7–4.0)
Lymphocytes Relative: 5 %
MCH: 28.8 pg (ref 26.0–34.0)
MCHC: 32.3 g/dL (ref 30.0–36.0)
MCV: 89.2 fL (ref 80.0–100.0)
MONOS PCT: 11 %
Monocytes Absolute: 1.6 10*3/uL — ABNORMAL HIGH (ref 0.1–1.0)
NEUTROS ABS: 12.1 10*3/uL — AB (ref 1.7–7.7)
Neutrophils Relative %: 83 %
Platelets: 217 10*3/uL (ref 150–400)
RBC: 5.28 MIL/uL (ref 4.22–5.81)
RDW: 13.2 % (ref 11.5–15.5)
WBC: 14.5 10*3/uL — ABNORMAL HIGH (ref 4.0–10.5)
nRBC: 0 % (ref 0.0–0.2)

## 2018-11-03 LAB — BLOOD GAS, VENOUS
ACID-BASE DEFICIT: 2.9 mmol/L — AB (ref 0.0–2.0)
Bicarbonate: 21.6 mmol/L (ref 20.0–28.0)
FIO2: 21
O2 SAT: 50.2 %
Patient temperature: 98.6
pCO2, Ven: 38.9 mmHg — ABNORMAL LOW (ref 44.0–60.0)
pH, Ven: 7.363 (ref 7.250–7.430)

## 2018-11-03 LAB — PROTIME-INR
INR: 1 (ref 0.8–1.2)
PROTHROMBIN TIME: 13.5 s (ref 11.4–15.2)

## 2018-11-03 LAB — URINALYSIS, ROUTINE W REFLEX MICROSCOPIC
BILIRUBIN URINE: NEGATIVE
Glucose, UA: >=500 mg/dL — AB
KETONES UR: NEGATIVE mg/dL
NITRITE: NEGATIVE
Protein, ur: 100 mg/dL — AB
RBC / HPF: >50 RBC/hpf — ABNORMAL HIGH (ref 0–5)
Specific Gravity, Urine: 1.022 (ref 1.005–1.030)
pH: 6 (ref 5.0–8.0)

## 2018-11-03 LAB — HIV ANTIBODY (ROUTINE TESTING W REFLEX): HIV Screen 4th Generation wRfx: NONREACTIVE

## 2018-11-03 LAB — CBG MONITORING, ED: Glucose-Capillary: 350 mg/dL — ABNORMAL HIGH (ref 70–99)

## 2018-11-03 LAB — GLUCOSE, CAPILLARY
Glucose-Capillary: 224 mg/dL — ABNORMAL HIGH (ref 70–99)
Glucose-Capillary: 217 mg/dL — ABNORMAL HIGH (ref 70–99)
Glucose-Capillary: 188 mg/dL — ABNORMAL HIGH (ref 70–99)
Glucose-Capillary: 206 mg/dL — ABNORMAL HIGH (ref 70–99)

## 2018-11-03 LAB — BETA-HYDROXYBUTYRIC ACID: BETA-HYDROXYBUTYRIC ACID: 0.95 mmol/L — AB (ref 0.05–0.27)

## 2018-11-03 LAB — TROPONIN I

## 2018-11-03 LAB — LACTIC ACID, PLASMA: LACTIC ACID, VENOUS: 2.4 mmol/L — AB (ref 0.5–1.9)

## 2018-11-03 MED ORDER — ONDANSETRON HCL 4 MG/2ML IJ SOLN
4.0000 mg | Freq: Once | INTRAMUSCULAR | Status: AC
  Administered 2018-11-03: 4 mg via INTRAVENOUS
  Filled 2018-11-03: qty 2

## 2018-11-03 MED ORDER — LIDOCAINE HCL URETHRAL/MUCOSAL 2 % EX GEL
1.0000 | Freq: Once | CUTANEOUS | Status: AC
Start: 2018-11-03 — End: 2018-11-03
  Administered 2018-11-03: 1 via URETHRAL
  Filled 2018-11-03: qty 5

## 2018-11-03 MED ORDER — FINASTERIDE 5 MG PO TABS
5.0000 mg | ORAL_TABLET | Freq: Every day | ORAL | Status: DC
  Administered 2018-11-03 – 2018-11-11 (×9): 5 mg via ORAL
  Filled 2018-11-03 (×9): qty 1

## 2018-11-03 MED ORDER — SODIUM CHLORIDE 0.9 % IV SOLN
1.0000 g | Freq: Once | INTRAVENOUS | Status: AC
  Administered 2018-11-03: 1 g via INTRAVENOUS
  Filled 2018-11-03: qty 10

## 2018-11-03 MED ORDER — ONDANSETRON HCL 4 MG/2ML IJ SOLN: 4.0000 mg | Freq: Four times a day (QID) | INTRAMUSCULAR | Status: DC | PRN

## 2018-11-03 MED ORDER — SORBITOL 70 % SOLN
30.0000 mL | Freq: Every day | Status: DC | PRN
  Filled 2018-11-03: qty 30

## 2018-11-03 MED ORDER — ASPIRIN 81 MG PO CHEW
81.0000 mg | CHEWABLE_TABLET | Freq: Every day | ORAL | Status: DC
  Administered 2018-11-03 – 2018-11-06 (×4): 81 mg via ORAL
  Filled 2018-11-03 (×4): qty 1

## 2018-11-03 MED ORDER — SODIUM CHLORIDE 0.9 % IV BOLUS
1000.0000 mL | Freq: Once | INTRAVENOUS | Status: AC
  Administered 2018-11-03: 1000 mL via INTRAVENOUS

## 2018-11-03 MED ORDER — ACETAMINOPHEN 650 MG RE SUPP: 650.0000 mg | Freq: Four times a day (QID) | RECTAL | Status: DC | PRN

## 2018-11-03 MED ORDER — SODIUM CHLORIDE 0.9 % IV SOLN
INTRAVENOUS | Status: DC
  Administered 2018-11-03 – 2018-11-08 (×12): via INTRAVENOUS

## 2018-11-03 MED ORDER — ADULT MULTIVITAMIN W/MINERALS CH
1.0000 | ORAL_TABLET | Freq: Every day | ORAL | Status: DC
  Administered 2018-11-03 – 2018-11-11 (×9): 1 via ORAL
  Filled 2018-11-03 (×9): qty 1

## 2018-11-03 MED ORDER — TAMSULOSIN HCL 0.4 MG PO CAPS
0.4000 mg | ORAL_CAPSULE | Freq: Every day | ORAL | Status: DC
  Administered 2018-11-03 – 2018-11-10 (×7): 0.4 mg via ORAL
  Filled 2018-11-03 (×7): qty 1

## 2018-11-03 MED ORDER — SODIUM CHLORIDE 0.9 % IV BOLUS (SEPSIS)
1000.0000 mL | Freq: Once | INTRAVENOUS | Status: AC
  Administered 2018-11-03: 1000 mL via INTRAVENOUS

## 2018-11-03 MED ORDER — INSULIN ASPART 100 UNIT/ML ~~LOC~~ SOLN
15.0000 [IU] | Freq: Once | SUBCUTANEOUS | Status: AC
  Administered 2018-11-03: 15 [IU] via SUBCUTANEOUS
  Filled 2018-11-03: qty 1

## 2018-11-03 MED ORDER — SODIUM CHLORIDE 0.9 % IV SOLN
1.0000 g | INTRAVENOUS | Status: DC
  Administered 2018-11-03 – 2018-11-04 (×2): 1 g via INTRAVENOUS
  Filled 2018-11-03: qty 10
  Filled 2018-11-03 (×2): qty 1

## 2018-11-03 MED ORDER — INSULIN ASPART 100 UNIT/ML ~~LOC~~ SOLN
0.0000 [IU] | Freq: Three times a day (TID) | SUBCUTANEOUS | Status: DC
  Administered 2018-11-04 (×2): 8 [IU] via SUBCUTANEOUS
  Administered 2018-11-04 – 2018-11-05 (×2): 3 [IU] via SUBCUTANEOUS
  Administered 2018-11-05 – 2018-11-06 (×3): 5 [IU] via SUBCUTANEOUS
  Administered 2018-11-06: 3 [IU] via SUBCUTANEOUS
  Administered 2018-11-06: 8 [IU] via SUBCUTANEOUS
  Administered 2018-11-07 (×2): 5 [IU] via SUBCUTANEOUS
  Administered 2018-11-07: 3 [IU] via SUBCUTANEOUS
  Administered 2018-11-08: 2 [IU] via SUBCUTANEOUS
  Administered 2018-11-08 – 2018-11-09 (×4): 5 [IU] via SUBCUTANEOUS
  Administered 2018-11-10 (×2): 8 [IU] via SUBCUTANEOUS
  Administered 2018-11-10 – 2018-11-11 (×2): 5 [IU] via SUBCUTANEOUS
  Administered 2018-11-11: 3 [IU] via SUBCUTANEOUS

## 2018-11-03 MED ORDER — POLYETHYLENE GLYCOL 3350 17 G PO PACK: 17.0000 g | PACK | Freq: Every day | ORAL | Status: DC | PRN

## 2018-11-03 MED ORDER — INSULIN ASPART 100 UNIT/ML ~~LOC~~ SOLN
0.0000 [IU] | Freq: Three times a day (TID) | SUBCUTANEOUS | Status: DC
  Administered 2018-11-03: 5 [IU] via SUBCUTANEOUS

## 2018-11-03 MED ORDER — INSULIN ASPART 100 UNIT/ML ~~LOC~~ SOLN
0.0000 [IU] | Freq: Every day | SUBCUTANEOUS | Status: DC
  Administered 2018-11-04: 3 [IU] via SUBCUTANEOUS
  Administered 2018-11-05: 2 [IU] via SUBCUTANEOUS
  Administered 2018-11-09 – 2018-11-10 (×2): 3 [IU] via SUBCUTANEOUS

## 2018-11-03 MED ORDER — ONDANSETRON HCL 4 MG PO TABS: 4.0000 mg | ORAL_TABLET | Freq: Four times a day (QID) | ORAL | Status: DC | PRN

## 2018-11-03 MED ORDER — CYCLOBENZAPRINE HCL 10 MG PO TABS
10.0000 mg | ORAL_TABLET | Freq: Three times a day (TID) | ORAL | Status: DC | PRN
  Administered 2018-11-03 – 2018-11-07 (×5): 10 mg via ORAL
  Filled 2018-11-03 (×8): qty 1

## 2018-11-03 MED ORDER — TRAMADOL HCL 50 MG PO TABS
50.0000 mg | ORAL_TABLET | Freq: Four times a day (QID) | ORAL | Status: DC | PRN
  Administered 2018-11-03 – 2018-11-07 (×4): 50 mg via ORAL
  Filled 2018-11-03 (×7): qty 1

## 2018-11-03 MED ORDER — INFLUENZA VAC SPLIT HIGH-DOSE 0.5 ML IM SUSY
0.5000 mL | PREFILLED_SYRINGE | INTRAMUSCULAR | Status: AC
  Administered 2018-11-04: 0.5 mL via INTRAMUSCULAR
  Filled 2018-11-03: qty 0.5

## 2018-11-03 MED ORDER — INSULIN GLARGINE 100 UNIT/ML ~~LOC~~ SOLN
10.0000 [IU] | Freq: Every day | SUBCUTANEOUS | Status: DC
  Administered 2018-11-03 – 2018-11-08 (×6): 10 [IU] via SUBCUTANEOUS
  Filled 2018-11-03 (×6): qty 0.1

## 2018-11-03 MED ORDER — PENTOSAN POLYSULFATE SODIUM 100 MG PO CAPS
100.0000 mg | ORAL_CAPSULE | Freq: Three times a day (TID) | ORAL | Status: DC
  Administered 2018-11-03 – 2018-11-11 (×24): 100 mg via ORAL
  Filled 2018-11-03 (×26): qty 1

## 2018-11-03 MED ORDER — ACETAMINOPHEN 325 MG PO TABS
650.0000 mg | ORAL_TABLET | Freq: Four times a day (QID) | ORAL | Status: DC | PRN
  Administered 2018-11-05 – 2018-11-10 (×3): 650 mg via ORAL
  Filled 2018-11-03 (×3): qty 2

## 2018-11-03 MED ORDER — INSULIN ASPART 100 UNIT/ML ~~LOC~~ SOLN
0.0000 [IU] | SUBCUTANEOUS | Status: DC
  Administered 2018-11-03: 5 [IU] via SUBCUTANEOUS
  Administered 2018-11-03: 3 [IU] via SUBCUTANEOUS
  Administered 2018-11-03: 5 [IU] via SUBCUTANEOUS

## 2018-11-03 MED ORDER — SODIUM CHLORIDE 0.9 % IV BOLUS
500.0000 mL | Freq: Once | INTRAVENOUS | Status: AC
  Administered 2018-11-03: 500 mL via INTRAVENOUS

## 2018-11-03 MED ORDER — SODIUM CHLORIDE 0.9 % IV SOLN: INTRAVENOUS | Status: DC

## 2018-11-03 NOTE — Consult Note (Signed)
Consultation: Gross hematuria, BPH, urinary retention, bilateral hydronephrosis, bladder stone Requested by: Dr. Jaci Carrel  History of Present Illness: Timothy Walsh is a 68 year old white male who noticed gross hematuria developed last night as well as malaise and dysuria.  He presented to emergency department and his white count was 14.5, creatinine 1.56 (baseline 1.49).  A 22 French three-way catheter was placed with grossly bloody urine.  A CT scan of the abdomen and pelvis was obtained which revealed bilateral hydroureteronephrosis down to a distended bladder, no ureteral stones, no clot in the bladder, prostate about 180 g, bladder stone of 2.9 cm.  He was given IV fluids, antibiotics and hand irrigation of the catheter.  His heart rate has come down.  He is still being bolused due to an elevated lactic acid.  Looking back he has had progressive voiding difficulty over the past couple of years.  He has had trouble emptying his bladder and has noticed incontinence especially at night when he will leak.  He will collect this in a bag and it has been worse over the past couple of months.  He has a history of DVT and chronic anticoagulation.  He has had a good diuresis.  He was seen earlier today on rounds around 12 noon.  Past Medical History:  Diagnosis Date  . Diabetes mellitus without complication (HCC)   . Hypertension   . MI, old    History reviewed. No pertinent surgical history.  Home Medications:  Medications Prior to Admission  Medication Sig Dispense Refill Last Dose  . aspirin 81 MG chewable tablet Chew 81 mg by mouth daily.   11/02/2018 at Unknown time  . ibuprofen (ADVIL,MOTRIN) 200 MG tablet Take 200 mg by mouth every 6 (six) hours as needed for headache, mild pain or moderate pain.   Past Month at Unknown time  . metFORMIN (GLUCOPHAGE) 500 MG tablet Take 1 tablet (500 mg total) by mouth 2 (two) times daily with a meal. 60 tablet 0 11/02/2018 at Unknown time  . Multiple  Vitamin (MULTIVITAMIN WITH MINERALS) TABS tablet Take 1 tablet by mouth daily.   11/02/2018 at Unknown time  . cyclobenzaprine (FLEXERIL) 10 MG tablet Take 1 tablet (10 mg total) by mouth 3 (three) times daily as needed for muscle spasms. (Patient not taking: Reported on 11/03/2018) 10 tablet 0 Not Taking at Unknown time  . Rivaroxaban 15 & 20 MG TBPK Take as directed on package: Start with one 15mg  tablet by mouth twice a day with food. On Day 22, switch to one 20mg  tablet once a day with food. (Patient not taking: Reported on 11/03/2018) 51 each 0 Not Taking at Unknown time  . traMADol (ULTRAM) 50 MG tablet Take 1 tablet (50 mg total) by mouth every 6 (six) hours as needed. (Patient not taking: Reported on 11/22/2017) 10 tablet 0 Not Taking at Unknown time   Allergies: No Known Allergies  No family history on file. Social History:  reports that he has never smoked. He has never used smokeless tobacco. No history on file for alcohol and drug.  ROS: A complete review of systems was performed.  All systems are negative except for pertinent findings as noted. Review of Systems  Constitutional: Positive for malaise/fatigue.  All other systems reviewed and are negative.    Physical Exam:  Vital signs in last 24 hours: Temp:  [97.9 F (36.6 C)-99.2 F (37.3 C)] 98.2 F (36.8 C) (03/05 1633) Pulse Rate:  [82-120] 82 (03/05 1633) Resp:  [16-25]  18 (03/05 1633) BP: (114-173)/(75-98) 124/75 (03/05 1633) SpO2:  [90 %-99 %] 95 % (03/05 1633) General:  Alert and oriented, No acute distress HEENT: Normocephalic, atraumatic Cardiovascular: Regular rate and rhythm Lungs: Regular rate and effort Abdomen: Soft, nontender, nondistended, no abdominal masses Back: No CVA tenderness Extremities: No edema Neurologic: Grossly intact GU: Penis circumcised and without mass or lesion, glans appeared normal, and meatus appeared normal, scrotum appeared normal.  Three-way Foley catheter in place with dark purple  urine but not a lot of clots.  Procedure: The 22 French three-way Foley was irrigated with the help of the nurse.  He had a lot of bladder spasm and just a few small clots.  I deflated 20 cc out of the 30 cc balloon.  I checked and there was still fluid in the balloon and I left 10 cc in the balloon.  The catheter looked to be seated appropriately.  It now irrigated much easier and quickly irrigated to clear with hand irrigation.  Laboratory Data:  Results for orders placed or performed during the hospital encounter of 11/03/18 (from the past 24 hour(s))  CBC with Differential/Platelet     Status: Abnormal   Collection Time: 11/03/18  2:48 AM  Result Value Ref Range   WBC 14.5 (H) 4.0 - 10.5 K/uL   RBC 5.28 4.22 - 5.81 MIL/uL   Hemoglobin 15.2 13.0 - 17.0 g/dL   HCT 16.1 09.6 - 04.5 %   MCV 89.2 80.0 - 100.0 fL   MCH 28.8 26.0 - 34.0 pg   MCHC 32.3 30.0 - 36.0 g/dL   RDW 40.9 81.1 - 91.4 %   Platelets 217 150 - 400 K/uL   nRBC 0.0 0.0 - 0.2 %   Neutrophils Relative % 83 %   Neutro Abs 12.1 (H) 1.7 - 7.7 K/uL   Lymphocytes Relative 5 %   Lymphs Abs 0.7 0.7 - 4.0 K/uL   Monocytes Relative 11 %   Monocytes Absolute 1.6 (H) 0.1 - 1.0 K/uL   Eosinophils Relative 0 %   Eosinophils Absolute 0.0 0.0 - 0.5 K/uL   Basophils Relative 0 %   Basophils Absolute 0.0 0.0 - 0.1 K/uL   WBC Morphology MILD LEFT SHIFT (1-5% METAS, OCC MYELO, OCC BANDS)    Immature Granulocytes 1 %   Abs Immature Granulocytes 0.08 (H) 0.00 - 0.07 K/uL  Basic metabolic panel     Status: Abnormal   Collection Time: 11/03/18  2:48 AM  Result Value Ref Range   Sodium 132 (L) 135 - 145 mmol/L   Potassium 4.0 3.5 - 5.1 mmol/L   Chloride 100 98 - 111 mmol/L   CO2 20 (L) 22 - 32 mmol/L   Glucose, Bld 451 (H) 70 - 99 mg/dL   BUN 21 8 - 23 mg/dL   Creatinine, Ser 7.82 (H) 0.61 - 1.24 mg/dL   Calcium 8.7 (L) 8.9 - 10.3 mg/dL   GFR calc non Af Amer 46 (L) >60 mL/min   GFR calc Af Amer 53 (L) >60 mL/min   Anion gap 12 5 -  15  Urinalysis, Routine w reflex microscopic     Status: Abnormal   Collection Time: 11/03/18  2:48 AM  Result Value Ref Range   Color, Urine RED (A) YELLOW   APPearance CLOUDY (A) CLEAR   Specific Gravity, Urine 1.022 1.005 - 1.030   pH 6.0 5.0 - 8.0   Glucose, UA >=500 (A) NEGATIVE mg/dL   Hgb urine dipstick LARGE (A) NEGATIVE  Bilirubin Urine NEGATIVE NEGATIVE   Ketones, ur NEGATIVE NEGATIVE mg/dL   Protein, ur 121 (A) NEGATIVE mg/dL   Nitrite NEGATIVE NEGATIVE   Leukocytes,Ua TRACE (A) NEGATIVE   RBC / HPF >50 (H) 0 - 5 RBC/hpf   WBC, UA >50 (H) 0 - 5 WBC/hpf   Bacteria, UA MANY (A) NONE SEEN   WBC Clumps PRESENT   Protime-INR     Status: None   Collection Time: 11/03/18  2:48 AM  Result Value Ref Range   Prothrombin Time 13.5 11.4 - 15.2 seconds   INR 1.0 0.8 - 1.2  Troponin I - ONCE - STAT     Status: None   Collection Time: 11/03/18  2:49 AM  Result Value Ref Range   Troponin I <0.03 <0.03 ng/mL  Blood gas, venous (at [facility] and [facility])     Status: Abnormal   Collection Time: 11/03/18  4:23 AM  Result Value Ref Range   FIO2 21.00    pH, Ven 7.363 7.250 - 7.430   pCO2, Ven 38.9 (L) 44.0 - 60.0 mmHg   Bicarbonate 21.6 20.0 - 28.0 mmol/L   Acid-base deficit 2.9 (H) 0.0 - 2.0 mmol/L   O2 Saturation 50.2 %   Patient temperature 98.6    Collection site VEIN    Drawn by DRAWN BY RN    Sample type VENOUS   Beta-hydroxybutyric acid     Status: Abnormal   Collection Time: 11/03/18  4:23 AM  Result Value Ref Range   Beta-Hydroxybutyric Acid 0.95 (H) 0.05 - 0.27 mmol/L  POC CBG, ED     Status: Abnormal   Collection Time: 11/03/18  6:11 AM  Result Value Ref Range   Glucose-Capillary 350 (H) 70 - 99 mg/dL  Lactic acid, plasma     Status: Abnormal   Collection Time: 11/03/18  6:29 AM  Result Value Ref Range   Lactic Acid, Venous 2.4 (HH) 0.5 - 1.9 mmol/L  Glucose, capillary     Status: Abnormal   Collection Time: 11/03/18  8:28 AM  Result Value Ref Range   Glucose-Capillary  224 (H) 70 - 99 mg/dL  HIV antibody (Routine Testing)     Status: None   Collection Time: 11/03/18  8:33 AM  Result Value Ref Range   HIV Screen 4th Generation wRfx Non Reactive Non Reactive  Basic metabolic panel     Status: Abnormal   Collection Time: 11/03/18  8:33 AM  Result Value Ref Range   Sodium 136 135 - 145 mmol/L   Potassium 3.7 3.5 - 5.1 mmol/L   Chloride 108 98 - 111 mmol/L   CO2 22 22 - 32 mmol/L   Glucose, Bld 241 (H) 70 - 99 mg/dL   BUN 20 8 - 23 mg/dL   Creatinine, Ser 6.24 (H) 0.61 - 1.24 mg/dL   Calcium 7.8 (L) 8.9 - 10.3 mg/dL   GFR calc non Af Amer 53 (L) >60 mL/min   GFR calc Af Amer >60 >60 mL/min   Anion gap 6 5 - 15  Basic metabolic panel     Status: Abnormal   Collection Time: 11/03/18 11:53 AM  Result Value Ref Range   Sodium 136 135 - 145 mmol/L   Potassium 3.9 3.5 - 5.1 mmol/L   Chloride 112 (H) 98 - 111 mmol/L   CO2 19 (L) 22 - 32 mmol/L   Glucose, Bld 248 (H) 70 - 99 mg/dL   BUN 16 8 - 23 mg/dL   Creatinine, Ser 4.69 (H)  0.61 - 1.24 mg/dL   Calcium 6.9 (L) 8.9 - 10.3 mg/dL   GFR calc non Af Amer 58 (L) >60 mL/min   GFR calc Af Amer >60 >60 mL/min   Anion gap 5 5 - 15  Glucose, capillary     Status: Abnormal   Collection Time: 11/03/18 12:40 PM  Result Value Ref Range   Glucose-Capillary 217 (H) 70 - 99 mg/dL  Basic metabolic panel     Status: Abnormal   Collection Time: 11/03/18  4:06 PM  Result Value Ref Range   Sodium 138 135 - 145 mmol/L   Potassium 3.8 3.5 - 5.1 mmol/L   Chloride 113 (H) 98 - 111 mmol/L   CO2 19 (L) 22 - 32 mmol/L   Glucose, Bld 238 (H) 70 - 99 mg/dL   BUN 16 8 - 23 mg/dL   Creatinine, Ser 0.45 0.61 - 1.24 mg/dL   Calcium 7.2 (L) 8.9 - 10.3 mg/dL   GFR calc non Af Amer >60 >60 mL/min   GFR calc Af Amer >60 >60 mL/min   Anion gap 6 5 - 15  Glucose, capillary     Status: Abnormal   Collection Time: 11/03/18  4:34 PM  Result Value Ref Range   Glucose-Capillary 188 (H) 70 - 99 mg/dL   No results found for this or  any previous visit (from the past 240 hour(s)). Creatinine: Recent Labs    11/03/18 0248 11/03/18 0833 11/03/18 1153 11/03/18 1606  CREATININE 1.56* 1.38* 1.27* 1.23    Impression/Assessment/plan:  #1 gross hematuria- likely from bladder distention, bladder stone and BPH.  This should settle down with Foley catheter drainage.  No worrisome findings on the CT.  Will eventually need cystoscopy.  #2 bilateral hydronephrosis-this is likely from urinary retention and reflux.  Should improve with bladder drainage.  He is having a good diuresis confirming the reflux.  #3 BPH with weak stream and likely overflow incontinence- I discussed with the patient and his wife the nature risks and benefits of tamsulosin and finasteride.  We discussed FDA warnings.  He will start both of these generic medications.  This is his best chance to control the bleeding and to improve voiding medically.  #4 bladder stone- we discussed in the long run he will need laser cystolitholopaxy versus bladder stone removal and robotic simple.  #5 UTI- culture pending  Will follow.  [name] 11/03/2018, 5:32 PM  \

## 2018-11-03 NOTE — ED Notes (Signed)
Bed: WA17 Expected date:  Expected time:  Means of arrival:  Comments: hematuria

## 2018-11-03 NOTE — ED Triage Notes (Signed)
Pt BIB [facility]. Pt reports 3 hours with burning upon urination with blood in urine. EMS states pt had a 500cc bag of red urine. Hx incontinence. No recent trauma. +Weakness  cbg 450 but missed dose of metformin

## 2018-11-03 NOTE — ED Provider Notes (Signed)
[facility]-EMERGENCY DEPT Provider Note   CSN: [id number] Arrival date & time: 11/03/18  0219    History   Chief Complaint Chief Complaint  Patient presents with  . Hematuria    HPI Wlliam Walsh is a 68 y.o. male.     Patient presents to the emergency department for evaluation of hematuria.  Patient reports that 4 hours ago he started to notice blood in his urine.  He has been urinating quite a bit since that started and there has been continuous amounts of blood.  He reports that there is pain in the penis when he urinates.  No flank pain.  Since he has been bleeding he has started to notice that he feels weak and fatigued.  No chest pain or shortness of breath.     Past Medical History:  Diagnosis Date  . Diabetes mellitus without complication (HCC)   . Hypertension   . MI, old     Patient Active Problem List   Diagnosis Date Noted  . Complicated UTI (urinary tract infection) 11/03/2018    History reviewed. No pertinent surgical history.      Home Medications    Prior to Admission medications   Medication Sig Start Date End Date Taking? Authorizing Provider  cyclobenzaprine (FLEXERIL) 10 MG tablet Take 1 tablet (10 mg total) by mouth 3 (three) times daily as needed for muscle spasms. 09/13/15   Raeford Razor, MD  ibuprofen (ADVIL,MOTRIN) 200 MG tablet Take 200 mg by mouth every 6 (six) hours as needed for headache, mild pain or moderate pain.    [provider]  metFORMIN (GLUCOPHAGE) 500 MG tablet Take 1 tablet (500 mg total) by mouth 2 (two) times daily with a meal. 11/22/17   Robinson, [place] N, PA-C  Rivaroxaban 15 & 20 MG TBPK Take as directed on package: Start with one  tablet by mouth twice a day with food. On Day 22, switch to one  tablet once a day with food. 11/22/17   Robinson, [place] N, PA-C  traMADol (ULTRAM) 50 MG tablet Take 1 tablet (50 mg total) by mouth every 6 (six) hours as needed. Patient not taking:  Reported on 11/22/2017 09/13/15   Raeford Razor, MD    Family History No family history on file.  Social History Social History   Tobacco Use  . Smoking status: Never Smoker  . Smokeless tobacco: Never Used  Substance Use Topics  . Alcohol use: Not on file  . Drug use: Not on file     Allergies   Patient has no known allergies.   Review of Systems Review of Systems  Constitutional: Positive for fatigue.  Genitourinary: Positive for dysuria and hematuria.  All other systems reviewed and are negative.    Physical Exam Updated Vital Signs BP 114/78   Pulse (!) 112   Temp 98.3 F (36.8 C) (Oral)   Resp (!) 21   SpO2 90%   Physical Exam Vitals signs and nursing note reviewed.  Constitutional:      General: He is not in acute distress.    Appearance: Normal appearance. He is well-developed.  HENT:     Head: Normocephalic and atraumatic.     Right Ear: Hearing normal.     Left Ear: Hearing normal.     Nose: Nose normal.  Eyes:     Conjunctiva/sclera: Conjunctivae normal.     Pupils: Pupils are equal, round, and reactive to light.  Neck:     Musculoskeletal: Normal range of  motion and neck supple.  Cardiovascular:     Rate and Rhythm: Regular rhythm. Tachycardia present.     Heart sounds: S1 normal and S2 normal. No murmur. No friction rub. No gallop.   Pulmonary:     Effort: Pulmonary effort is normal. No respiratory distress.     Breath sounds: Normal breath sounds.  Chest:     Chest wall: No tenderness.  Abdominal:     General: Bowel sounds are normal.     Palpations: Abdomen is soft.     Tenderness: There is no abdominal tenderness. There is no guarding or rebound. Negative signs include Murphy's sign and McBurney's sign.     Hernia: No hernia is present.  Musculoskeletal: Normal range of motion.  Skin:    General: Skin is warm and dry.     Findings: No rash.  Neurological:     Mental Status: He is alert and oriented to person, place, and time.      GCS: GCS eye subscore is 4. GCS verbal subscore is 5. GCS motor subscore is 6.     Cranial Nerves: No cranial nerve deficit.     Sensory: No sensory deficit.     Coordination: Coordination normal.  Psychiatric:        Speech: Speech normal.        Behavior: Behavior normal.        Thought Content: Thought content normal.      ED Treatments / Results  Labs (all labs ordered are listed, but only abnormal results are displayed) Labs Reviewed  CBC WITH DIFFERENTIAL/PLATELET - Abnormal; Notable for the following components:      Result Value   WBC 14.5 (*)    Neutro Abs 12.1 (*)    Monocytes Absolute 1.6 (*)    Abs Immature Granulocytes 0.08 (*)    All other components within normal limits  BASIC METABOLIC PANEL - Abnormal; Notable for the following components:   Sodium 132 (*)    CO2 20 (*)    Glucose, Bld 451 (*)    Creatinine, Ser 1.56 (*)    Calcium 8.7 (*)    GFR calc non Af Amer 46 (*)    GFR calc Af Amer 53 (*)    All other components within normal limits  URINALYSIS, ROUTINE W REFLEX MICROSCOPIC - Abnormal; Notable for the following components:   Color, Urine RED (*)    APPearance CLOUDY (*)    Glucose, UA >=500 (*)    Hgb urine dipstick LARGE (*)    Protein, ur 100 (*)    Leukocytes,Ua TRACE (*)    RBC / HPF >50 (*)    WBC, UA >50 (*)    Bacteria, UA MANY (*)    All other components within normal limits  BLOOD GAS, VENOUS - Abnormal; Notable for the following components:   pCO2, Ven 38.9 (*)    Acid-base deficit 2.9 (*)    All other components within normal limits  CBG MONITORING, ED - Abnormal; Notable for the following components:   Glucose-Capillary 350 (*)    All other components within normal limits  URINE CULTURE  CULTURE, BLOOD (ROUTINE X 2)  CULTURE, BLOOD (ROUTINE X 2)  PROTIME-INR  TROPONIN I  BETA-HYDROXYBUTYRIC ACID  LACTIC ACID, PLASMA    EKG EKG Interpretation  Date/Time:  Thursday November 03 2018 02:41:47 EST Ventricular Rate:  119 PR  Interval:    QRS Duration: 134 QT Interval:  344 QTC Calculation: 484 R Axis:   91  Text Interpretation:  Sinus tachycardia RBBB and LPFB antero-lateral ST-T changes, new since last tracing Confirmed by Gilda Crease [phone]) on 11/03/2018 2:45:43 AM   Radiology Ct Renal Stone Study  Result Date: 11/03/2018 CLINICAL DATA:  Hematuria EXAM: CT ABDOMEN AND PELVIS WITHOUT CONTRAST TECHNIQUE: Multidetector CT imaging of the abdomen and pelvis was performed following the standard protocol without IV contrast. COMPARISON:  None. FINDINGS: LOWER CHEST: There is no basilar pleural or apical pericardial effusion. HEPATOBILIARY: The hepatic contours and density are normal. There is no intra- or extrahepatic biliary dilatation. The gallbladder is normal. PANCREAS: The pancreatic parenchymal contours are normal and there is no ductal dilatation. There is no peripancreatic fluid collection. SPLEEN: Normal. ADRENALS/URINARY TRACT: --Adrenal glands: Normal. --Right kidney/ureter: There is severe hydroureteronephrosis with mild perinephric stranding. At the right ureteral orifice, there is a large bladder calculus that measures up to 30 mm. --Left kidney/ureter: There is severe left hydroureteronephrosis with mild periureteral and perinephric stranding. No ureteral stone. --Urinary bladder: 30 mm calculus within the urinary bladder near the right ureteral orifice. There is diffuse bladder wall thickening. STOMACH/BOWEL: --Stomach/Duodenum: There is no hiatal hernia or other gastric abnormality. The duodenal course and caliber are normal. --Small bowel: No dilatation or inflammation. --Colon: No focal abnormality. --Appendix: Normal. VASCULAR/LYMPHATIC: Normal course and caliber of the major abdominal vessels. No abdominal or pelvic lymphadenopathy. REPRODUCTIVE: Enlarged prostate measures 7.3 cm in transverse dimension. MUSCULOSKELETAL. No bony spinal canal stenosis or focal osseous abnormality. OTHER: None.  IMPRESSION: 1. Severe bilateral hydroureteronephrosis with 3 cm bladder calculus near the right ureteral orifice. No ureterolithiasis. 2. 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 Electronically Signed   By: Deatra Robinson M.D.   On: 11/03/2018 05:04    Procedures Procedures (including critical care time)  Medications Ordered in ED Medications  insulin aspart (novoLOG) injection 0-9 Units (has no administration in time range)  insulin aspart (novoLOG) injection 0-5 Units (has no administration in time range)  sodium chloride 0.9 % bolus 1,000 mL (has no administration in time range)  0.9 %  sodium chloride infusion (has no administration in time range)  sodium chloride 0.9 % bolus 1,000 mL (has no administration in time range)  sodium chloride 0.9 % bolus 500 mL (0 mLs Intravenous Stopped 11/03/18 0403)  cefTRIAXone (ROCEPHIN) 1 g in sodium chloride 0.9 % 100 mL IVPB (0 g Intravenous Stopped 11/03/18 0501)  ondansetron (ZOFRAN) injection 4 mg (4 mg Intravenous Given 11/03/18 0435)  insulin aspart (novoLOG) injection 15 Units (15 Units Subcutaneous Given 11/03/18 0504)  lidocaine (XYLOCAINE) 2 % jelly 1 application (1 application Urethral Given 11/03/18 0621)     Initial Impression / Assessment and Plan / ED Course  I have reviewed the triage vital signs and the nursing notes.  Pertinent labs & imaging results that were available during my care of the patient were reviewed by me and considered in my medical decision making (see chart for details).        Patient presents to the emergency department for evaluation of hematuria.  Patient reports that he woke up 4 hours ago with urge to urinate.  When he went to the bathroom he noticed there was blood in the urine and there was pain with urination.  He has had multiple episodes of hematuria since then.  Patient feels weak, thinks he might of lost a lot of blood.  Patient mildly  tachycardic at arrival.  He is hyperglycemic without signs of DKA.  He does not have any anemia on CBC.  Urinalysis consistent with infection.  Culture pending.  Patient initiated on IV Rocephin.  Because of the amount of hematuria, underwent CT scan to further evaluate.  Patient does have bilateral hydronephrosis with enlarged prostate, likely outlet obstruction.  A Foley catheter has been placed.  Bladder is draining.  No significant kidney injury noted on chemistries.  Based on hematuria while on Xarelto, bladder outlet obstruction with UTI, will admit patient.  Discussed with Dr. Mena Goes, on-call for urology.  Will see patient.  Addendum: 08:10.  Beta hydroxybutyric acid has now returned and is elevated.  Patient does not, however, have any anion gap.  Might have some very slight DKA.  Final Clinical Impressions(s) / ED Diagnoses   Final diagnoses:  Gross hematuria  Lower urinary tract infectious disease    ED Discharge Orders    None       Gilda Crease, MD 11/03/18 0800

## 2018-11-03 NOTE — H&P (Signed)
Timothy Walsh is an 68 y.o. male.   Chief Complaint: Dysuria and hematuria. HPI: The patient is a 68 yr old man who states that he has had 3 days of burning with urination, then this morning he noticed that he was urinating blood. He is also feeling weak and fatigued. He denies fevers or chills, nausea or vomiting. He has noticed urinary hesitancy as well.  The patient has a past medical history significant for DM II, hypertension, CAD, History of MI.  In the ED the patient had Blood pressure (!) 151/93, pulse (!) 101, temperature 98.9 F (37.2 C), temperature source Oral, resp. rate 16, SpO2 98 %.  His sodium was 136. potassium was 3.7. BUN was 1.38. Lactic acid was 2.4. Beta hydroxybutyrate was elevated at 0.95. Glucose was 451. UA demonstrated many bacteria, positive leukocyte esterase, and large blood.   A CT renal stone study was obtained. It demonstrated bilateral hydroureteronephrosis with 3 cm bladder calculus with bladder wall thickening and an enlarged prostate. The EKG demonstrated tachycardia, RBBB, and AFSB.  The hospitalists were consulted to admit the patient for further evaluation and treatment. The patient has received IV rocephin in the ED. Urology has been consulted.  Past Medical History:  Diagnosis Date  . Diabetes mellitus without complication (HCC)   . Hypertension   . MI, old     History reviewed. No pertinent surgical history.  No family history on file. Social History:  reports that he has never smoked. He has never used smokeless tobacco. No history on file for alcohol and drug. Medications Prior to Admission  Medication Sig Dispense Refill  . aspirin 81 MG chewable tablet Chew 81 mg by mouth daily.    Marland Kitchen ibuprofen (ADVIL,MOTRIN) 200 MG tablet Take 200 mg by mouth every 6 (six) hours as needed for headache, mild pain or moderate pain.    . metFORMIN (GLUCOPHAGE) 500 MG tablet Take 1 tablet (500 mg total) by mouth 2 (two) times daily with a meal. 60 tablet 0  .  Multiple Vitamin (MULTIVITAMIN WITH MINERALS) TABS tablet Take 1 tablet by mouth daily.    . cyclobenzaprine (FLEXERIL) 10 MG tablet Take 1 tablet (10 mg total) by mouth 3 (three) times daily as needed for muscle spasms. (Patient not taking: Reported on 11/03/2018) 10 tablet 0  . Rivaroxaban 15 & 20 MG TBPK Take as directed on package: Start with one  tablet by mouth twice a day with food. On Day 22, switch to one  tablet once a day with food. (Patient not taking: Reported on 11/03/2018) 51 each 0  . traMADol (ULTRAM) 50 MG tablet Take 1 tablet (50 mg total) by mouth every 6 (six) hours as needed. (Patient not taking: Reported on 11/22/2017) 10 tablet 0    Allergies: No Known Allergies  Pertinent items are noted in HPI.   General appearance: alert, cooperative and mild distress Head: Normocephalic, without obvious abnormality, atraumatic Eyes: conjunctivae/corneas clear. PERRL, EOM's intact. Fundi benign. Throat: lips, mucosa, and tongue normal; teeth and gums normal Neck: no adenopathy, no carotid bruit, no JVD, supple, symmetrical, trachea midline and thyroid not enlarged, symmetric, no tenderness/mass/nodules Resp: No increased work of breathing. No wheezes, rales, or rhonchi. No tactile fremitus. Chest wall: no tenderness Cardio: regular rate and rhythm, S1, S2 normal, no murmur, click, rub or gallop GI: soft, non-tender; bowel sounds normal; no masses,  no organomegaly Extremities: extremities normal, atraumatic, no cyanosis or edema Pulses: 2+ and symmetric Skin: Skin color, texture, turgor normal. No  rashes or lesions Lymph nodes: Cervical, supraclavicular, and axillary nodes normal. Neurologic: Alert and oriented X 3, normal strength and tone. Normal symmetric reflexes. Normal coordination and gait   Results for orders placed or performed during the hospital encounter of 11/03/18 (from the past 48 hour(s))  CBC with Differential/Platelet     Status: Abnormal   Collection Time:  11/03/18  2:48 AM  Result Value Ref Range   WBC 14.5 (H) 4.0 - 10.5 K/uL   RBC 5.28 4.22 - 5.81 MIL/uL   Hemoglobin 15.2 13.0 - 17.0 g/dL   HCT 16.3 84.5 - 36.4 %   MCV 89.2 80.0 - 100.0 fL   MCH 28.8 26.0 - 34.0 pg   MCHC 32.3 30.0 - 36.0 g/dL   RDW 68.0 32.1 - 22.4 %   Platelets 217 150 - 400 K/uL   nRBC 0.0 0.0 - 0.2 %   Neutrophils Relative % 83 %   Neutro Abs 12.1 (H) 1.7 - 7.7 K/uL   Lymphocytes Relative 5 %   Lymphs Abs 0.7 0.7 - 4.0 K/uL   Monocytes Relative 11 %   Monocytes Absolute 1.6 (H) 0.1 - 1.0 K/uL   Eosinophils Relative 0 %   Eosinophils Absolute 0.0 0.0 - 0.5 K/uL   Basophils Relative 0 %   Basophils Absolute 0.0 0.0 - 0.1 K/uL   WBC Morphology MILD LEFT SHIFT (1-5% METAS, OCC MYELO, OCC BANDS)    Immature Granulocytes 1 %   Abs Immature Granulocytes 0.08 (H) 0.00 - 0.07 K/uL    Comment: Performed at [facility], [address], [place], [place] [address]  Basic metabolic panel     Status: Abnormal   Collection Time: 11/03/18  2:48 AM  Result Value Ref Range   Sodium 132 (L) 135 - 145 mmol/L   Potassium 4.0 3.5 - 5.1 mmol/L   Chloride 100 98 - 111 mmol/L   CO2 20 (L) 22 - 32 mmol/L   Glucose, Bld 451 (H) 70 - 99 mg/dL   BUN 21 8 - 23 mg/dL   Creatinine, Ser 3.70 (H) 0.61 - 1.24 mg/dL   Calcium 8.7 (L) 8.9 - 10.3 mg/dL   GFR calc non Af Amer 46 (L) >60 mL/min   GFR calc Af Amer 53 (L) >60 mL/min   Anion gap 12 5 - 15    Comment: Performed at [facility], [address] [place], [place] [address]  Urinalysis, Routine w reflex microscopic     Status: Abnormal   Collection Time: 11/03/18  2:48 AM  Result Value Ref Range   Color, Urine RED (A) YELLOW    Comment: BIOCHEMICALS MAY BE AFFECTED BY COLOR   APPearance CLOUDY (A) CLEAR   Specific Gravity, Urine 1.022 1.005 - 1.030   pH 6.0 5.0 - 8.0   Glucose, UA >=500 (A) NEGATIVE mg/dL   Hgb urine dipstick LARGE (A) NEGATIVE   Bilirubin Urine NEGATIVE NEGATIVE    Ketones, ur NEGATIVE NEGATIVE mg/dL   Protein, ur 169 (A) NEGATIVE mg/dL   Nitrite NEGATIVE NEGATIVE   Leukocytes,Ua TRACE (A) NEGATIVE   RBC / HPF >50 (H) 0 - 5 RBC/hpf   WBC, UA >50 (H) 0 - 5 WBC/hpf   Bacteria, UA MANY (A) NONE SEEN   WBC Clumps PRESENT     Comment: Performed at [facility], [address], [place], [place] [address]  Protime-INR     Status: None   Collection Time: 11/03/18  2:48 AM  Result Value Ref Range   Prothrombin Time 13.5 11.4 - 15.2 seconds   INR 1.0 0.8 - 1.2    Comment: (NOTE) INR goal varies based on device and disease states. Performed at [facility], [address], [place], [place] [address]   Troponin I - ONCE - STAT     Status: None   Collection Time: 11/03/18  2:49 AM  Result Value Ref Range   Troponin I <0.03 <0.03 ng/mL    Comment: Performed at [facility], [address], [place], [place] [address]  Blood gas, venous (at [facility])     Status: Abnormal   Collection Time: 11/03/18  4:23 AM  Result Value Ref Range   FIO2 21.00    pH, Ven 7.363 7.250 - 7.430   pCO2, Ven 38.9 (L) 44.0 - 60.0 mmHg   Bicarbonate 21.6 20.0 - 28.0 mmol/L   Acid-base deficit 2.9 (H) 0.0 - 2.0 mmol/L   O2 Saturation 50.2 %   Patient temperature 98.6    Collection site VEIN    Drawn by DRAWN BY RN    Sample type VENOUS     Comment: Performed at [facility], [address], [place], [place] [address]  Beta-hydroxybutyric acid     Status: Abnormal   Collection Time: 11/03/18  4:23 AM  Result Value Ref Range   Beta-Hydroxybutyric Acid 0.95 (H) 0.05 - 0.27 mmol/L    Comment: Performed at [facility], [address], [place], [place] [address]  POC CBG, ED     Status: Abnormal   Collection Time: 11/03/18  6:11 AM  Result Value Ref Range   Glucose-Capillary 350 (H) 70 - 99 mg/dL  Lactic acid, plasma     Status: Abnormal   Collection Time: 11/03/18  6:29 AM   Result Value Ref Range   Lactic Acid, Venous 2.4 (HH) 0.5 - 1.9 mmol/L    Comment: CRITICAL RESULT CALLED TO, READ BACK BY AND VERIFIED WITH: COOPER,H. RN AT 8413 11/03/18 MULLINS,T Performed at [facility], [address] [place], [place] [address]   Glucose, capillary     Status: Abnormal   Collection Time: 11/03/18  8:28 AM  Result Value Ref Range   Glucose-Capillary 224 (H) 70 - 99 mg/dL  Basic metabolic panel     Status: Abnormal   Collection Time: 11/03/18  8:33 AM  Result Value Ref Range   Sodium 136 135 - 145 mmol/L   Potassium 3.7 3.5 - 5.1 mmol/L   Chloride 108 98 - 111 mmol/L   CO2 22 22 - 32 mmol/L   Glucose, Bld 241 (H) 70 - 99 mg/dL   BUN 20 8 - 23 mg/dL   Creatinine, Ser 0.27 (H) 0.61 - 1.24 mg/dL   Calcium 7.8 (L) 8.9 - 10.3 mg/dL   GFR calc non Af Amer 53 (L) >60 mL/min   GFR calc Af Amer >60 >60 mL/min   Anion gap 6 5 - 15    Comment: Performed at [facility], [address], [place], [place] [address]   @  Blood pressure (!) 151/93, pulse (!) 101, temperature 98.9 F (37.2 C), temperature source Oral, resp. rate 16, SpO2 98 %.    Assessment/Plan Problem  Sepsis Due to Urinary Tract Infection (Hcc)  Calculus of Urinary Bladder  Bilateral Hydronephrosis  Dka (Diabetic Ketoacidoses) (Hcc)  DM II (Diabetes Mellitus, Type Ii), Controlled (Hcc)  Hematuria  Chronic Anticoagulation  Cad (  Coronary Artery Disease)  Essential Hypertension  Hyponatremia  The patient will be admitted to a telemetry bed. He has received IV rocephin in the ED. Blood cultures and urine cultures have been obtained and will be monitored. His Xarelto has been held due to his hematuria. His hemoglobin and hematocrit will be monitored. He will receive something for the pain from his cystitis. He is receiving aggressing IV fluids, Lantus, and SSI. His chemistries will be followed every 4 hours throughout the day. The patient has a very  mild DKA, and I believe that this can be handled on the floor without insulin gtt. Urology has been consulted. His home medications will be continued as possible. His metformin has been held.  I have seen and examined this patient myself. I have spent 74 minutes in his evaluation and care. I have viewed and interpreted the EKG myself.  Irelyn Perfecto 11/03/2018, 9:22 AM

## 2018-11-03 NOTE — ED Notes (Signed)
Patient transported to CT 

## 2018-11-03 NOTE — Care Management (Signed)
This is a no charge note  Pending admission per Dr. Blinda Leatherwood  68 year old man with past medical history of DVT on Xarelto, CAD, myocardial infarction, hypertension, diabetes mellitus, who presents with hematuria and burning on urination.  Found to have positive urinalysis for UTI.  WBC 14.5, no fever. Has tachycardia and tachypnea. IV Rocephin started by EDP.  Patient is admitted to telemetry bed as inpatient. Dr. Blinda Leatherwood will consult urologist.  CT per renal stone study: 1. Severe bilateral hydroureteronephrosis with 3 cm bladder calculus near the right ureteral orifice. No ureterolithiasis. 2. 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   [name], MD  [facility]   If 7PM-7AM, please contact night-coverage [email or web address] Password [facility] 11/03/2018, 6:13 AM

## 2018-11-03 NOTE — ED Notes (Addendum)
CRITICAL VALUE ALERT  Critical Value:  Lactic Acid 2.4  Date & Time Notied: 11/03/2018 @ 7628  Provider Notified: Herbert Moors, MD  Orders Received/Actions taken: Face to face communication.

## 2018-11-03 NOTE — ED Notes (Signed)
ED TO INPATIENT HANDOFF REPORT  ED Nurse Name and Phone #: Jeanice Lim, RN  S Name/Age/Gender Timothy Walsh 68 y.o. male Room/Bed: WA17/WA17  Code Status   Code Status: Not on file  Home/SNF/Other Home Patient oriented to: self, place, time and situation Is this baseline? Yes   Triage Complete: Triage complete  Chief Complaint hematuria  Triage Note Pt BIB [facility]. Pt reports 3 hours with burning upon urination with blood in urine. EMS states pt had a 500cc bag of red urine. Hx incontinence. No recent trauma. +Weakness  cbg 450 but missed dose of metformin     Allergies No Known Allergies  Level of Care/Admitting Diagnosis ED Disposition    ED Disposition Condition Comment   Admit  Hospital Area: [facility] [[id number]]  Level of Care: Telemetry [5]  Admit to tele based on following criteria: Other see comments  Comments: Sepsis and tachycardia  Diagnosis: Complicated UTI (urinary tract infection) [010272]  Admitting Physician: [name] [[id number]]  Attending Physician: [name] [phone]  Estimated length of stay: past midnight tomorrow  Certification:: I certify this patient will need inpatient services for at least 2 midnights  PT Class (Do Not Modify): Inpatient [101]  PT Acc Code (Do Not Modify): Private [1]       B Medical/Surgery History Past Medical History:  Diagnosis Date  . Diabetes mellitus without complication (HCC)   . Hypertension   . MI, old    History reviewed. No pertinent surgical history.   A IV Location/Drains/Wounds Patient Lines/Drains/Airways Status   Active Line/Drains/Airways    Name:   Placement date:   Placement time:   Site:   Days:   Peripheral IV 11/03/18 Left Antecubital   11/03/18    -    Antecubital   less than 1   Urethral Catheter Reef Achterberg RN Triple-lumen 22 Fr.   11/03/18    0620    Triple-lumen   less than 1          Intake/Output Last 24 hours  Intake/Output Summary (Last 24 hours) at 11/03/2018  0743 Last data filed at 11/03/2018 0740 Gross per 24 hour  Intake 1600 ml  Output 900 ml  Net 700 ml    Labs/Imaging Results for orders placed or performed during the hospital encounter of 11/03/18 (from the past 48 hour(s))  CBC with Differential/Platelet     Status: Abnormal   Collection Time: 11/03/18  2:48 AM  Result Value Ref Range   WBC 14.5 (H) 4.0 - 10.5 K/uL   RBC 5.28 4.22 - 5.81 MIL/uL   Hemoglobin 15.2 13.0 - 17.0 g/dL   HCT 44.0 34.7 - 42.5 %   MCV 89.2 80.0 - 100.0 fL   MCH 28.8 26.0 - 34.0 pg   MCHC 32.3 30.0 - 36.0 g/dL   RDW 95.6 38.7 - 56.4 %   Platelets 217 150 - 400 K/uL   nRBC 0.0 0.0 - 0.2 %   Neutrophils Relative % 83 %   Neutro Abs 12.1 (H) 1.7 - 7.7 K/uL   Lymphocytes Relative 5 %   Lymphs Abs 0.7 0.7 - 4.0 K/uL   Monocytes Relative 11 %   Monocytes Absolute 1.6 (H) 0.1 - 1.0 K/uL   Eosinophils Relative 0 %   Eosinophils Absolute 0.0 0.0 - 0.5 K/uL   Basophils Relative 0 %   Basophils Absolute 0.0 0.0 - 0.1 K/uL   WBC Morphology MILD LEFT SHIFT (1-5% METAS, OCC MYELO, OCC BANDS)    Immature Granulocytes  1 %   Abs Immature Granulocytes 0.08 (H) 0.00 - 0.07 K/uL    Comment: Performed at [facility], [address], [place], [place] [address]  Basic metabolic panel     Status: Abnormal   Collection Time: 11/03/18  2:48 AM  Result Value Ref Range   Sodium 132 (L) 135 - 145 mmol/L   Potassium 4.0 3.5 - 5.1 mmol/L   Chloride 100 98 - 111 mmol/L   CO2 20 (L) 22 - 32 mmol/L   Glucose, Bld 451 (H) 70 - 99 mg/dL   BUN 21 8 - 23 mg/dL   Creatinine, Ser 8.33 (H) 0.61 - 1.24 mg/dL   Calcium 8.7 (L) 8.9 - 10.3 mg/dL   GFR calc non Af Amer 46 (L) >60 mL/min   GFR calc Af Amer 53 (L) >60 mL/min   Anion gap 12 5 - 15    Comment: Performed at [facility], [address], [place], [place] [address]  Urinalysis, Routine w reflex microscopic     Status: Abnormal   Collection Time: 11/03/18  2:48 AM  Result Value Ref Range    Color, Urine RED (A) YELLOW    Comment: BIOCHEMICALS MAY BE AFFECTED BY COLOR   APPearance CLOUDY (A) CLEAR   Specific Gravity, Urine 1.022 1.005 - 1.030   pH 6.0 5.0 - 8.0   Glucose, UA >=500 (A) NEGATIVE mg/dL   Hgb urine dipstick LARGE (A) NEGATIVE   Bilirubin Urine NEGATIVE NEGATIVE   Ketones, ur NEGATIVE NEGATIVE mg/dL   Protein, ur 191 (A) NEGATIVE mg/dL   Nitrite NEGATIVE NEGATIVE   Leukocytes,Ua TRACE (A) NEGATIVE   RBC / HPF >50 (H) 0 - 5 RBC/hpf   WBC, UA >50 (H) 0 - 5 WBC/hpf   Bacteria, UA MANY (A) NONE SEEN   WBC Clumps PRESENT     Comment: Performed at [facility], [address], [place], [place] [address]  Protime-INR     Status: None   Collection Time: 11/03/18  2:48 AM  Result Value Ref Range   Prothrombin Time 13.5 11.4 - 15.2 seconds   INR 1.0 0.8 - 1.2    Comment: (NOTE) INR goal varies based on device and disease states. Performed at [facility], [address], [place], [place] [address]   Troponin I - ONCE - STAT     Status: None   Collection Time: 11/03/18  2:49 AM  Result Value Ref Range   Troponin I <0.03 <0.03 ng/mL    Comment: Performed at [facility], [address], [place], [place] [address]  Blood gas, venous (at [facility] and [facility])     Status: Abnormal   Collection Time: 11/03/18  4:23 AM  Result Value Ref Range   FIO2 21.00    pH, Ven 7.363 7.250 - 7.430   pCO2, Ven 38.9 (L) 44.0 - 60.0 mmHg   Bicarbonate 21.6 20.0 - 28.0 mmol/L   Acid-base deficit 2.9 (H) 0.0 - 2.0 mmol/L   O2 Saturation 50.2 %   Patient temperature 98.6    Collection site VEIN    Drawn by DRAWN BY RN    Sample type VENOUS     Comment: Performed at [facility], [address], [place], [place] [address]  Beta-hydroxybutyric acid     Status: Abnormal   Collection Time: 11/03/18  4:23 AM  Result Value Ref Range   Beta-Hydroxybutyric Acid 0.95 (H) 0.05 - 0.27 mmol/L  Comment: Performed at  [facility], [address] [place], [place] [address]  POC CBG, ED     Status: Abnormal   Collection Time: 11/03/18  6:11 AM  Result Value Ref Range   Glucose-Capillary 350 (H) 70 - 99 mg/dL  Lactic acid, plasma     Status: Abnormal   Collection Time: 11/03/18  6:29 AM  Result Value Ref Range   Lactic Acid, Venous 2.4 (HH) 0.5 - 1.9 mmol/L    Comment: CRITICAL RESULT CALLED TO, READ BACK BY AND VERIFIED WITH: Peachie Barkalow,H. RN AT [id number] 11/03/18 MULLINS,T Performed at [facility], [address] [place], [place] [address]    Ct Renal Stone Study  Result Date: 11/03/2018 CLINICAL DATA:  Hematuria EXAM: CT ABDOMEN AND PELVIS WITHOUT CONTRAST TECHNIQUE: Multidetector CT imaging of the abdomen and pelvis was performed following the standard protocol without IV contrast. COMPARISON:  None. FINDINGS: LOWER CHEST: There is no basilar pleural or apical pericardial effusion. HEPATOBILIARY: The hepatic contours and density are normal. There is no intra- or extrahepatic biliary dilatation. The gallbladder is normal. PANCREAS: The pancreatic parenchymal contours are normal and there is no ductal dilatation. There is no peripancreatic fluid collection. SPLEEN: Normal. ADRENALS/URINARY TRACT: --Adrenal glands: Normal. --Right kidney/ureter: There is severe hydroureteronephrosis with mild perinephric stranding. At the right ureteral orifice, there is a large bladder calculus that measures up to 30 mm. --Left kidney/ureter: There is severe left hydroureteronephrosis with mild periureteral and perinephric stranding. No ureteral stone. --Urinary bladder: 30 mm calculus within the urinary bladder near the right ureteral orifice. There is diffuse bladder wall thickening. STOMACH/BOWEL: --Stomach/Duodenum: There is no hiatal hernia or other gastric abnormality. The duodenal course and caliber are normal. --Small bowel: No dilatation or inflammation. --Colon: No focal abnormality.  --Appendix: Normal. VASCULAR/LYMPHATIC: Normal course and caliber of the major abdominal vessels. No abdominal or pelvic lymphadenopathy. REPRODUCTIVE: Enlarged prostate measures 7.3 cm in transverse dimension. MUSCULOSKELETAL. No bony spinal canal stenosis or focal osseous abnormality. OTHER: None. IMPRESSION: 1. Severe bilateral hydroureteronephrosis with 3 cm bladder calculus near the right ureteral orifice. No ureterolithiasis. 2. 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 Electronically Signed   By: Deatra RobinsonKevin  Herman M.D.   On: 11/03/2018 05:04    Pending Labs Unresulted Labs (From admission, onward)    Start     Ordered   11/03/18 [id number]  Culture, blood (Routine X 2) w Reflex to ID Panel  BLOOD CULTURE X 2,   R    Comments:  Please obtain prior to antibiotic administration.    11/03/18 0612   11/03/18 0233  Urine Culture  Once,   R     11/03/18 0232          Vitals/Pain Today's Vitals   11/03/18 0400 11/03/18 0430 11/03/18 0530 11/03/18 0600  BP: (!) 173/98 (!) 152/90 (!) 135/92 114/78  Pulse: (!) 105 (!) 109 (!) 109 (!) 112  Resp: 19 (!) 25 19 (!) 21  Temp:      TempSrc:      SpO2: 95% 94% 93% 90%    Isolation Precautions No active isolations  Medications Medications  insulin aspart (novoLOG) injection 0-9 Units (has no administration in time range)  insulin aspart (novoLOG) injection 0-5 Units (has no administration in time range)  0.9 %  sodium chloride infusion ( Intravenous New Bag/Given 11/03/18 0626)  sodium chloride 0.9 % bolus 1,000 mL (0 mLs Intravenous Hold 11/03/18 0632)  sodium chloride  0.9 % bolus 500 mL (0 mLs Intravenous Stopped 11/03/18 0403)  cefTRIAXone (ROCEPHIN) 1 g in sodium chloride 0.9 % 100 mL IVPB (0 g Intravenous Stopped 11/03/18 0501)  ondansetron (ZOFRAN) injection 4 mg (4 mg Intravenous Given 11/03/18 0435)  insulin aspart (novoLOG) injection 15 Units (15 Units Subcutaneous Given 11/03/18  0504)  lidocaine (XYLOCAINE) 2 % jelly 1 application (1 application Urethral Given 11/03/18 0621)  sodium chloride 0.9 % bolus 1,000 mL (0 mLs Intravenous Stopped 11/03/18 0740)    Mobility walks with device Low fall risk   Focused Assessments Renal Assessment Handoff:  Hemodialysis Schedule: Not on dialysis Last Hemodialysis date and time: Not on dialysis   Restricted appendage: No restricted limbs     R Recommendations: See Admitting Provider Note  Report given to: Hillary, RN  Additional Notes: Lactic Acid reported

## 2018-11-03 NOTE — ED Notes (Signed)
Per Dr. Blinda Leatherwood, foley placement needs to be a 3-way cath. Provider at bedside for foley insertion. Pt tolerated well.

## 2018-11-03 NOTE — ED Notes (Signed)
Report called to [facility], RN and pt will be transported to 1415 on tele with Molly Maduro, NA. Pt denies any complaints or needs at time of transport.

## 2018-11-03 NOTE — ED Notes (Signed)
EKG given to EDP,Pollina,MD., for review. 

## 2018-11-04 LAB — CBC
HCT: 36.4 % — ABNORMAL LOW (ref 39.0–52.0)
Hemoglobin: 11.2 g/dL — ABNORMAL LOW (ref 13.0–17.0)
MCH: 28.5 pg (ref 26.0–34.0)
MCHC: 30.8 g/dL (ref 30.0–36.0)
MCV: 92.6 fL (ref 80.0–100.0)
Platelets: 153 10*3/uL (ref 150–400)
RBC: 3.93 MIL/uL — ABNORMAL LOW (ref 4.22–5.81)
RDW: 13.5 % (ref 11.5–15.5)
WBC: 11.6 10*3/uL — ABNORMAL HIGH (ref 4.0–10.5)
nRBC: 0 % (ref 0.0–0.2)

## 2018-11-04 LAB — BASIC METABOLIC PANEL
Anion gap: 6 (ref 5–15)
BUN: 15 mg/dL (ref 8–23)
CO2: 19 mmol/L — ABNORMAL LOW (ref 22–32)
Calcium: 7.1 mg/dL — ABNORMAL LOW (ref 8.9–10.3)
Chloride: 112 mmol/L — ABNORMAL HIGH (ref 98–111)
Creatinine, Ser: 1.22 mg/dL (ref 0.61–1.24)
GFR calc Af Amer: >60 mL/min (ref >60)
GFR calc non Af Amer: >60 mL/min (ref >60)
Glucose, Bld: 175 mg/dL — ABNORMAL HIGH (ref 70–99)
Potassium: 3.6 mmol/L (ref 3.5–5.1)
Sodium: 137 mmol/L (ref 135–145)

## 2018-11-04 LAB — GLUCOSE, CAPILLARY
GLUCOSE-CAPILLARY: 253 mg/dL — AB (ref 70–99)
Glucose-Capillary: 166 mg/dL — ABNORMAL HIGH (ref 70–99)
Glucose-Capillary: 169 mg/dL — ABNORMAL HIGH (ref 70–99)
Glucose-Capillary: 266 mg/dL — ABNORMAL HIGH (ref 70–99)
Glucose-Capillary: 273 mg/dL — ABNORMAL HIGH (ref 70–99)

## 2018-11-04 LAB — PROTIME-INR
INR: 1.3 — ABNORMAL HIGH (ref 0.8–1.2)
Prothrombin Time: 15.8 seconds — ABNORMAL HIGH (ref 11.4–15.2)

## 2018-11-04 MED ORDER — VANCOMYCIN HCL IN DEXTROSE 1-5 GM/200ML-% IV SOLN
1000.0000 mg | Freq: Once | INTRAVENOUS | Status: AC
  Administered 2018-11-04: 1000 mg via INTRAVENOUS
  Filled 2018-11-04: qty 200

## 2018-11-04 MED ORDER — VANCOMYCIN HCL 10 G IV SOLR
1250.0000 mg | Freq: Every day | INTRAVENOUS | Status: DC
  Administered 2018-11-05: 1250 mg via INTRAVENOUS
  Filled 2018-11-04 (×2): qty 1250

## 2018-11-04 NOTE — Progress Notes (Signed)
Pharmacy Antibiotic Note  Timothy Walsh is a 68 y.o. male admitted on 11/03/2018 with UTI d/t stone. Patient already on Rocephin, but now with worsening LUTS and SA growing in urine Cx; Pharmacy has been consulted for vancomycin dosing.  Plan:  Vancomycin 1000 mg IV now, then 1250 mg IV q24 hr (est AUC 499 based on SCr 1.22)  Measure vancomycin AUC at steady state as indicated  Continue Rocephin as ordered  F/u SA sensitivities, d/c vanc as appropriate   Height: 5\' 9"  (175.3 cm) Weight: 204 lb 12.9 oz (92.9 kg) IBW/kg (Calculated) : 70.7  Temp (24hrs), Avg:97.9 F (36.6 C), Min:97.5 F (36.4 C), Max:98.2 F (36.8 C)  Recent Labs  Lab 11/03/18 0248 11/03/18 0629 11/03/18 0833 11/03/18 1153 11/03/18 1606 11/03/18 2001 11/04/18 0536  WBC 14.5*  --   --   --   --   --  11.6*  CREATININE 1.56*  --  1.38* 1.27* 1.23 1.33* 1.22  LATICACIDVEN  --  2.4*  --   --   --   --   --     Estimated Creatinine Clearance: 67.1 mL/min (by C-G formula based on SCr of 1.22 mg/dL).    No Known Allergies   Thank you for allowing pharmacy to be a part of this patient's care.  Bernadene Person, PharmD, BCPS [phone] 11/04/2018, 3:53 PM

## 2018-11-04 NOTE — Progress Notes (Addendum)
Subjective: Patient reports feeling well.  Catheter burning a little.  Objective: Vital signs in last 24 hours: Temp:  [97.5 F (36.4 C)-99.2 F (37.3 C)] 97.5 F (36.4 C) (03/06 0427) Pulse Rate:  [82-101] 82 (03/06 0427) Resp:  [15-20] 15 (03/06 0427) BP: (123-151)/(70-93) 139/71 (03/06 0427) SpO2:  [93 %-98 %] 95 % (03/06 0427) Weight:  [92.9 kg] 92.9 kg (03/06 0427)  Intake/Output from previous day: 03/05 0701 - 03/06 0700 In: 5120 [P.O.:1090; I.V.:1929.9; IV Piggyback:2100.1] Out: 3200 [Urine:3200] Intake/Output this shift: No intake/output data recorded.  Physical Exam:  NAD Watching TV - urine light red, no clots  Abd - soft, NT Ext - No calf pain or swelling  GU- urine light red in tubing.  Much lighter than yesterday.  No clots.  Lab Results: Recent Labs    11/03/18 0248 11/04/18 0536  HGB 15.2 11.2*  HCT 47.1 36.4*   BMET Recent Labs    11/03/18 2001 11/04/18 0536  NA 136 137  K 3.6 3.6  CL 111 112*  CO2 19* 19*  GLUCOSE 236* 175*  BUN 15 15  CREATININE 1.33* 1.22  CALCIUM 7.1* 7.1*   Recent Labs    11/03/18 0248 11/04/18 0536  INR 1.0 1.3*   No results for input(s): LABURIN in the last 72 hours. No results found for this or any previous visit.  Studies/Results: Ct Renal Stone Study  Result Date: 11/03/2018 CLINICAL DATA:  Hematuria EXAM: CT ABDOMEN AND PELVIS WITHOUT CONTRAST TECHNIQUE: Multidetector CT imaging of the abdomen and pelvis was performed following the standard protocol without IV contrast. COMPARISON:  None. FINDINGS: LOWER CHEST: There is no basilar pleural or apical pericardial effusion. HEPATOBILIARY: The hepatic contours and density are normal. There is no intra- or extrahepatic biliary dilatation. The gallbladder is normal. PANCREAS: The pancreatic parenchymal contours are normal and there is no ductal dilatation. There is no peripancreatic fluid collection. SPLEEN: Normal. ADRENALS/URINARY TRACT: --Adrenal glands: Normal.  --Right kidney/ureter: There is severe hydroureteronephrosis with mild perinephric stranding. At the right ureteral orifice, there is a large bladder calculus that measures up to 30 mm. --Left kidney/ureter: There is severe left hydroureteronephrosis with mild periureteral and perinephric stranding. No ureteral stone. --Urinary bladder: 30 mm calculus within the urinary bladder near the right ureteral orifice. There is diffuse bladder wall thickening. STOMACH/BOWEL: --Stomach/Duodenum: There is no hiatal hernia or other gastric abnormality. The duodenal course and caliber are normal. --Small bowel: No dilatation or inflammation. --Colon: No focal abnormality. --Appendix: Normal. VASCULAR/LYMPHATIC: Normal course and caliber of the major abdominal vessels. No abdominal or pelvic lymphadenopathy. REPRODUCTIVE: Enlarged prostate measures 7.3 cm in transverse dimension. MUSCULOSKELETAL. No bony spinal canal stenosis or focal osseous abnormality. OTHER: None. IMPRESSION: 1. Severe bilateral hydroureteronephrosis with 3 cm bladder calculus near the right ureteral orifice. No ureterolithiasis. 2. 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 Electronically Signed   By: Deatra Robinson M.D.   On: 11/03/2018 05:04    Assessment/Plan: #1 gross hematuria- likely from bladder distention, bladder stone and BPH.  This should settle down with Foley catheter drainage.  No worrisome findings on the CT.  Will eventually need cystoscopy.  H&H down some today likely dilutional and blood loss.   #2 bilateral hydronephrosis-this is likely from urinary retention and reflux.  Should improve with bladder drainage.  UOP 3.2L with another 300 cc in the bag.  His creatinine has improved.  #3 BPH with weak stream and likely overflow incontinence- tamsulosin and  finasteride started.  He asked if he could use a condom cath for the incontinence and again I went over the fact  that the overflow incontinence is a sign he is not emptying his bladder and he should seek help.   #4 bladder stone- we discussed in the long run he will need laser cystolitholopaxy versus bladder stone removal and robotic simple.  #5 UTI- culture pending   LOS: 1 day   [name] 11/04/2018, 7:52 AM

## 2018-11-04 NOTE — Progress Notes (Signed)
PROGRESS NOTE    Timothy Walsh  VJK:[id number] DOB: 17-Dec-1950 DOA: 11/03/2018 PCP: Patient, No Pcp Per   Brief Narrative:  68 year old with history of CAD, history of MI, diabetes mellitus type 2, essential hypertension came to the hospital with complains of urinary burning and hematuria.  On CT of the abdomen was noted to have bilateral hydroureteronephrosis with renal stone and enlarged prostate.  Urology was consulted.  IV Rocephin started.Recommended to start Flomax and finasteride with possible need for outpatient stone removal procedure   Assessment & Plan:   Active Problems:   Complicated UTI (urinary tract infection)   Sepsis due to urinary tract infection (HCC)   Calculus of urinary bladder   Bilateral hydronephrosis   DKA (diabetic ketoacidoses) (HCC)   DM II (diabetes mellitus, type II), controlled (HCC)   Hematuria   Chronic anticoagulation   CAD (coronary artery disease)   Essential hypertension  Bilateral hydronephrosis secondary to bladder stone and gross hematuria Benign prostatic hyperplasia - Foley catheter in place, will continue to care for this.  Monitor hemoglobin levels.  Urology recommended watching this for now and starting patient on Flomax and finasteride.  In the future will need some sort of stone removal procedure if necessary -Ucx - Stap Aureus, continue IV Rocephin. Add Vanc -Supportive care -Xarelto on hold, IV fluids  History of coronary artery disease -Currently patient is chest pain-free.  Continue aspirin.  Diabetes mellitus type 2, uncontrolled -Insulin sliding scale and Accu-Chek.  Metformin on hold.  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  Still reports of significant burning with urination.  Review of Systems Otherwise negative except as per HPI, including: General: Denies fever, chills, night sweats or unintended weight loss. Resp: Denies cough, wheezing, shortness of breath. Cardiac: Denies chest pain, palpitations, orthopnea, paroxysmal nocturnal dyspnea. GI: Denies abdominal pain, nausea, vomiting, diarrhea or constipation GU: Denies hesitancy MS: Denies muscle aches, joint pain or swelling Neuro: Denies headache, neurologic deficits (focal weakness, numbness, tingling), abnormal gait Psych: Denies anxiety, depression, SI/HI/AVH Skin: Denies new rashes or lesions ID: Denies sick contacts, exotic exposures, travel  Objective: Vitals:   11/03/18 2024 11/04/18 0142 11/04/18 0427 11/04/18 0910  BP: 127/79 123/70 139/71 133/75  Pulse: 84 86 82 91  Resp: 17 18 15 20   Temp: 98 F (36.7 C) (!) 97.5 F (36.4 C) (!) 97.5 F (36.4 C) 97.9 F (36.6 C)  TempSrc: Oral Oral Oral Oral  SpO2: 94% 96% 95% (!) 89%  Weight:   92.9 kg   Height:   5\' 9"  (1.753 m)     Intake/Output Summary (Last 24 hours) at 11/04/2018 1323 Last data filed at 11/04/2018 0914 Gross per 24 hour  Intake 3359.96 ml  Output 2525 ml  Net 834.96 ml   Filed Weights   11/04/18 0427  Weight: 92.9 kg    Examination:  General exam: Appears calm and comfortable  Respiratory system: Clear to auscultation. Respiratory effort normal. Cardiovascular system: S1 & S2 heard, RRR. No JVD, murmurs, rubs, gallops or clicks. No pedal edema. Gastrointestinal system:  Abdomen is nondistended, soft and nontender. No organomegaly or masses felt. Normal bowel sounds heard. Central nervous system: Alert and oriented. No focal neurological deficits. Extremities: Symmetric 5 x 5 power. Skin: No rashes,  lesions or ulcers Psychiatry: Judgement and insight appear normal. Mood & affect appropriate.  Foley catheter in place- bright red appearing slight urine noted.   Data Reviewed:   CBC: Recent Labs  Lab 11/03/18 0248 11/04/18 0536  WBC 14.5* 11.6*  NEUTROABS 12.1*  --   HGB 15.2 11.2*  HCT 47.1 36.4*  MCV 89.2 92.6  PLT 217 153   Basic Metabolic Panel: Recent Labs  Lab 11/03/18 0833 11/03/18 1153 11/03/18 1606 11/03/18 2001 11/04/18 0536  NA 136 136 138 136 137  K 3.7 3.9 3.8 3.6 3.6  CL 108 112* 113* 111 112*  CO2 22 19* 19* 19* 19*  GLUCOSE 241* 248* 238* 236* 175*  BUN 20 16 16 15 15   CREATININE 1.38* 1.27* 1.23 1.33* 1.22  CALCIUM 7.8* 6.9* 7.2* 7.1* 7.1*   GFR: Estimated Creatinine Clearance: 67.1 mL/min (by C-G formula based on SCr of 1.22 mg/dL). Liver Function Tests: No results for input(s): AST, ALT, ALKPHOS, BILITOT, PROT, ALBUMIN in the last 168 hours. No results for input(s): LIPASE, AMYLASE in the last 168 hours. No results for input(s): AMMONIA in the last 168 hours. Coagulation Profile: Recent Labs  Lab 11/03/18 0248 11/04/18 0536  INR 1.0 1.3*   Cardiac Enzymes: Recent Labs  Lab 11/03/18 0249  TROPONINI <0.03   BNP (last 3 results) No results for input(s): PROBNP in the last 8760 hours. HbA1C: No results for input(s): HGBA1C in the last 72 hours. CBG: Recent Labs  Lab 11/03/18 1634 11/03/18 2017 11/04/18 0427 11/04/18 0731 11/04/18 1149  GLUCAP 188* 206* 166* 169* 253*   Lipid Profile: No results for input(s): CHOL, HDL, LDLCALC, TRIG, CHOLHDL, LDLDIRECT in the last 72 hours. Thyroid Function Tests: No results for input(s): TSH, T4TOTAL, FREET4, T3FREE, THYROIDAB in the last 72 hours. Anemia Panel: No results for input(s): VITAMINB12, FOLATE, FERRITIN, TIBC, IRON, RETICCTPCT in the last 72 hours. Sepsis Labs: Recent Labs  Lab 11/03/18 6160  LATICACIDVEN 2.4*    Recent Results (from the past 240 hour(s))  Urine Culture      Status: Abnormal (Preliminary result)   Collection Time: 11/03/18  2:48 AM  Result Value Ref Range Status   Specimen Description   Final    URINE, CLEAN CATCH Performed at [facility], [address] [place], [place] [address]    Special Requests   Final    NONE Performed at [facility], [address] [place], [place] [address]    Culture >=100,000 COLONIES/mL STAPHYLOCOCCUS AUREUS (A)  Final   Report Status PENDING  Incomplete  Culture, blood (Routine X 2) w Reflex to ID Panel     Status: None (Preliminary result)   Collection Time: 11/03/18  6:29 AM  Result Value Ref Range Status   Specimen Description   Final    BLOOD RIGHT ANTECUBITAL Performed at [facility], [address] [place], [place] [address]    Special Requests   Final    BOTTLES DRAWN AEROBIC AND ANAEROBIC Blood Culture adequate volume Performed at [facility], [address], [place], [place] [address]    Culture   Final    NO GROWTH 1 DAY Performed at [facility], [address] [place], [place] [address]    Report Status  PENDING  Incomplete  Culture, blood (Routine X 2) w Reflex to ID Panel     Status: None (Preliminary result)   Collection Time: 11/03/18  6:29 AM  Result Value Ref Range Status   Specimen Description   Final    BLOOD LEFT ANTECUBITAL Performed at [facility], [address] [place], [place] [address]    Special Requests   Final    BOTTLES DRAWN AEROBIC AND ANAEROBIC Blood Culture adequate volume Performed at [facility], [address] [place], [place] [address]    Culture   Final    NO GROWTH 1 DAY Performed at [facility], [address] [place], [place] [address]    Report Status PENDING  Incomplete         Radiology Studies: Ct Renal Stone Study  Result Date: 11/03/2018 CLINICAL DATA:  Hematuria EXAM: CT ABDOMEN AND PELVIS WITHOUT  CONTRAST TECHNIQUE: Multidetector CT imaging of the abdomen and pelvis was performed following the standard protocol without IV contrast. COMPARISON:  None. FINDINGS: LOWER CHEST: There is no basilar pleural or apical pericardial effusion. HEPATOBILIARY: The hepatic contours and density are normal. There is no intra- or extrahepatic biliary dilatation. The gallbladder is normal. PANCREAS: The pancreatic parenchymal contours are normal and there is no ductal dilatation. There is no peripancreatic fluid collection. SPLEEN: Normal. ADRENALS/URINARY TRACT: --Adrenal glands: Normal. --Right kidney/ureter: There is severe hydroureteronephrosis with mild perinephric stranding. At the right ureteral orifice, there is a large bladder calculus that measures up to 30 mm. --Left kidney/ureter: There is severe left hydroureteronephrosis with mild periureteral and perinephric stranding. No ureteral stone. --Urinary bladder: 30 mm calculus within the urinary bladder near the right ureteral orifice. There is diffuse bladder wall thickening. STOMACH/BOWEL: --Stomach/Duodenum: There is no hiatal hernia or other gastric abnormality. The duodenal course and caliber are normal. --Small bowel: No dilatation or inflammation. --Colon: No focal abnormality. --Appendix: Normal. VASCULAR/LYMPHATIC: Normal course and caliber of the major abdominal vessels. No abdominal or pelvic lymphadenopathy. REPRODUCTIVE: Enlarged prostate measures 7.3 cm in transverse dimension. MUSCULOSKELETAL. No bony spinal canal stenosis or focal osseous abnormality. OTHER: None. IMPRESSION: 1. Severe bilateral hydroureteronephrosis with 3 cm bladder calculus near the right ureteral orifice. No ureterolithiasis. 2. 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 Electronically Signed   By: Deatra RobinsonKevin  Herman M.D.   On: 11/03/2018 05:04        Scheduled Meds: . aspirin  81 mg Oral Daily  . finasteride  5  mg Oral Daily  . insulin aspart  0-15 Units Subcutaneous TID WC  . insulin aspart  0-5 Units Subcutaneous QHS  . insulin glargine  10 Units Subcutaneous Daily  . multivitamin with minerals  1 tablet Oral Daily  . pentosan polysulfate  100 mg Oral TID  . tamsulosin  0.4 mg Oral QPC supper   Continuous Infusions: . sodium chloride 100 mL/hr at 11/04/18 0820  . cefTRIAXone (ROCEPHIN)  IV 1 g (11/03/18 2142)     LOS: 1 day   Time spent= 40 mins    Hau Sanor Joline Maxcyhirag Tamica Covell, MD [facility]  If 7PM-7AM, please contact night-coverage [email or web address] 11/04/2018, 1:23 PM

## 2018-11-05 LAB — URINE CULTURE: Culture: >=100000 — AB

## 2018-11-05 LAB — CBC
HCT: 37 % — ABNORMAL LOW (ref 39.0–52.0)
Hemoglobin: 11.3 g/dL — ABNORMAL LOW (ref 13.0–17.0)
MCH: 28.5 pg (ref 26.0–34.0)
MCHC: 30.5 g/dL (ref 30.0–36.0)
MCV: 93.2 fL (ref 80.0–100.0)
PLATELETS: 165 10*3/uL (ref 150–400)
RBC: 3.97 MIL/uL — ABNORMAL LOW (ref 4.22–5.81)
RDW: 13.3 % (ref 11.5–15.5)
WBC: 10.3 10*3/uL (ref 4.0–10.5)
nRBC: 0 % (ref 0.0–0.2)

## 2018-11-05 LAB — BASIC METABOLIC PANEL
Anion gap: 3 — ABNORMAL LOW (ref 5–15)
BUN: 15 mg/dL (ref 8–23)
CALCIUM: 7.6 mg/dL — AB (ref 8.9–10.3)
CO2: 23 mmol/L (ref 22–32)
CREATININE: 1.09 mg/dL (ref 0.61–1.24)
Chloride: 109 mmol/L (ref 98–111)
GFR calc Af Amer: >60 mL/min (ref >60)
GFR calc non Af Amer: >60 mL/min (ref >60)
Glucose, Bld: 218 mg/dL — ABNORMAL HIGH (ref 70–99)
Potassium: 3.2 mmol/L — ABNORMAL LOW (ref 3.5–5.1)
SODIUM: 135 mmol/L (ref 135–145)

## 2018-11-05 LAB — GLUCOSE, CAPILLARY
Glucose-Capillary: 185 mg/dL — ABNORMAL HIGH (ref 70–99)
Glucose-Capillary: 247 mg/dL — ABNORMAL HIGH (ref 70–99)
Glucose-Capillary: 211 mg/dL — ABNORMAL HIGH (ref 70–99)

## 2018-11-05 LAB — CREATININE, SERUM
CREATININE: 1.13 mg/dL (ref 0.61–1.24)
GFR calc Af Amer: >60 mL/min (ref >60)
GFR calc non Af Amer: >60 mL/min (ref >60)

## 2018-11-05 LAB — MAGNESIUM: MAGNESIUM: 1.8 mg/dL (ref 1.7–2.4)

## 2018-11-05 MED ORDER — RIVAROXABAN 20 MG PO TABS
20.0000 mg | ORAL_TABLET | Freq: Every day | ORAL | Status: DC
  Administered 2018-11-05: 20 mg via ORAL
  Filled 2018-11-05 (×2): qty 1

## 2018-11-05 MED ORDER — POTASSIUM CHLORIDE CRYS ER 20 MEQ PO TBCR
40.0000 meq | EXTENDED_RELEASE_TABLET | Freq: Once | ORAL | Status: AC
  Administered 2018-11-05: 40 meq via ORAL
  Filled 2018-11-05: qty 2

## 2018-11-05 MED ORDER — AMOXICILLIN 250 MG PO CAPS
500.0000 mg | ORAL_CAPSULE | Freq: Three times a day (TID) | ORAL | Status: DC
  Administered 2018-11-05 – 2018-11-09 (×11): 500 mg via ORAL
  Filled 2018-11-05 (×11): qty 2

## 2018-11-05 NOTE — Plan of Care (Signed)
Urine is still bloody.

## 2018-11-05 NOTE — Plan of Care (Signed)

## 2018-11-05 NOTE — Progress Notes (Addendum)
Urology Inpatient Progress Report  Lower urinary tract infectious disease [N39.0] Gross hematuria [R31.0] Sepsis due to urinary tract infection (HCC) [A41.9, N39.0]        Intv/Subj: No acute events overnight. Patient is without complaint. Minimal hematuria Creatinine improving  Active Problems:   Complicated UTI (urinary tract infection)   Sepsis due to urinary tract infection (HCC)   Calculus of urinary bladder   Bilateral hydronephrosis   DKA (diabetic ketoacidoses) (HCC)   DM II (diabetes mellitus, type II), controlled (HCC)   Hematuria   Chronic anticoagulation   CAD (coronary artery disease)   Essential hypertension  Current Facility-Administered Medications  Medication Dose Route Frequency Provider Last Rate Last Dose  . 0.9 %  sodium chloride infusion   Intravenous Continuous [name], MD 100 mL/hr at 11/05/18 [phone]    . acetaminophen (TYLENOL) tablet 650 mg  650 mg Oral Q6H PRN [name], [name], DO       Or  . acetaminophen (TYLENOL) suppository 650 mg  650 mg Rectal Q6H PRN [name], [name], DO      . aspirin chewable tablet 81 mg  81 mg Oral Daily [name], [name], DO   81 mg at 11/05/18 0843  . cefTRIAXone (ROCEPHIN) 1 g in sodium chloride 0.9 % 100 mL IVPB  1 g Intravenous Q24H [name], [name], DO 200 mL/hr at 11/04/18 2228 1 g at 11/04/18 2228  . cyclobenzaprine (FLEXERIL) tablet 10 mg  10 mg Oral TID PRN [name], [name], DO   10 mg at 11/05/18 0843  . finasteride (PROSCAR) tablet 5 mg  5 mg Oral Daily [name], MD   5 mg at 11/05/18 0843  . insulin aspart (novoLOG) injection 0-15 Units  0-15 Units Subcutaneous TID WC Schorr, [name], NP   3 Units at 11/05/18 0841  . insulin aspart (novoLOG) injection 0-5 Units  0-5 Units Subcutaneous QHS [name], MD   3 Units at 11/04/18 2222  . insulin glargine (LANTUS) injection 10 Units  10 Units Subcutaneous Daily [name], [name], DO   10 Units at 11/05/18 0842  . multivitamin with minerals tablet 1 tablet  1 tablet Oral Daily  [name], [name], DO   1 tablet at 11/05/18 0842  . ondansetron (ZOFRAN) tablet 4 mg  4 mg Oral Q6H PRN [name], [name], DO       Or  . ondansetron (ZOFRAN) injection 4 mg  4 mg Intravenous Q6H PRN [name], [name], DO      . pentosan polysulfate (ELMIRON) capsule 100 mg  100 mg Oral TID [name], [name], DO   100 mg at 11/05/18 0844  . polyethylene glycol (MIRALAX / GLYCOLAX) packet 17 g  17 g Oral Daily PRN [name], [name], DO      . sorbitol 70 % solution 30 mL  30 mL Oral Daily PRN [name], [name], DO      . tamsulosin (FLOMAX) capsule 0.4 mg  0.4 mg Oral QPC supper [name], MD   0.4 mg at 11/04/18 1641  . traMADol (ULTRAM) tablet 50 mg  50 mg Oral Q6H PRN [name], [name], DO   50 mg at 11/05/18 0843  . vancomycin (VANCOCIN) 1,250 mg in sodium chloride 0.9 % 250 mL IVPB  1,250 mg Intravenous QHS [name], RPH 166.7 mL/hr at 11/05/18 0106 1,250 mg at 11/05/18 0106     Objective: Vital: Vitals:   11/04/18 1422 11/04/18 1924 11/05/18 0104 11/05/18 0620  BP: 134/71 (!) 147/81 (!) 167/83 (!) 152/83  Pulse: 86 83 84 81  Resp: (!) 24 20 20 20   Temp: 98.1 F (36.7 C) 98 F (36.7 C) (!) 97.5 F (36.4 C) 98 F (36.7 C)  TempSrc: Oral Oral Oral Oral  SpO2: 94% 96% 97% 94%  Weight:      Height:       I/Os: I/O last 3 completed shifts: In: 5526.1 [P.O.:2060; I.V.:3017.7; IV Piggyback:448.4] Out: 5025 [Urine:5025]  Physical Exam:  General: Patient is in no apparent distress Lungs: Normal respiratory effort, chest expands symmetrically. GI: The abdomen is soft and nontender without mass. Foley: Three-way Foley catheter in place with amber urine in tubing Ext: lower extremities symmetric  Lab Results: Recent Labs    11/03/18 0248 11/04/18 0536 11/05/18 0825  WBC 14.5* 11.6* 10.3  HGB 15.2 11.2* 11.3*  HCT 47.1 36.4* 37.0*   Recent Labs    11/03/18 2001 11/04/18 0536 11/05/18 0511 11/05/18 0825  NA 136 137  --  135  K 3.6 3.6  --  3.2*  CL 111 112*  --  109  CO2 19* 19*  --  23   GLUCOSE 236* 175*  --  218*  BUN 15 15  --  15  CREATININE 1.33* 1.22 1.13 1.09  CALCIUM 7.1* 7.1*  --  7.6*   Recent Labs    11/03/18 0248 11/04/18 0536  INR 1.0 1.3*   No results for input(s): LABURIN in the last 72 hours. Results for orders placed or performed during the hospital encounter of 11/03/18  Urine Culture     Status: Abnormal   Collection Time: 11/03/18  2:48 AM  Result Value Ref Range Status   Specimen Description   Final    URINE, CLEAN CATCH Performed at [facility], [address], [place], [place] [address]    Special Requests   Final    NONE Performed at [facility], [address] [place], [place] [address]    Culture >=100,000 COLONIES/mL STAPHYLOCOCCUS AUREUS (A)  Final   Report Status 11/05/2018 FINAL  Final   Organism ID, Bacteria STAPHYLOCOCCUS AUREUS (A)  Final      Susceptibility   Staphylococcus aureus - MIC*    CIPROFLOXACIN 2 INTERMEDIATE Intermediate     GENTAMICIN <=0.5 SENSITIVE Sensitive     NITROFURANTOIN <=16 SENSITIVE Sensitive     OXACILLIN <=0.25 SENSITIVE Sensitive     TETRACYCLINE <=1 SENSITIVE Sensitive     VANCOMYCIN <=0.5 SENSITIVE Sensitive     TRIMETH/SULFA <=10 SENSITIVE Sensitive     CLINDAMYCIN <=0.25 SENSITIVE Sensitive     RIFAMPIN <=0.5 SENSITIVE Sensitive     Inducible Clindamycin NEGATIVE Sensitive     * >=100,000 COLONIES/mL STAPHYLOCOCCUS AUREUS  Culture, blood (Routine X 2) w Reflex to ID Panel     Status: None (Preliminary result)   Collection Time: 11/03/18  6:29 AM  Result Value Ref Range Status   Specimen Description   Final    BLOOD RIGHT ANTECUBITAL Performed at [facility], [address], [place], [place] [address]    Special Requests   Final    BOTTLES DRAWN AEROBIC AND ANAEROBIC Blood Culture adequate volume Performed at [facility], [address] [place], [place] [address]    Culture   Final    NO GROWTH 2  DAYS Performed at [facility], [address] [place], [place] [address]    Report Status PENDING  Incomplete  Culture, blood (Routine X 2) w Reflex to ID Panel  Status: None (Preliminary result)   Collection Time: 11/03/18  6:29 AM  Result Value Ref Range Status   Specimen Description   Final    BLOOD LEFT ANTECUBITAL Performed at [facility], [address] [place], [place] [address]    Special Requests   Final    BOTTLES DRAWN AEROBIC AND ANAEROBIC Blood Culture adequate volume Performed at [facility], [address], [place], [place] [address]    Culture   Final    NO GROWTH 2 DAYS Performed at [facility], [address] [place], [place] [address]    Report Status PENDING  Incomplete    Studies/Results: No results found.  Assessment: Gross hematuria--improving.  Continue Foley catheter.  Can be discharged with Foley catheter but follow-up in 1 week.  Okay to resume blood thinners.  Bilateral hydronephrosis-- possibly secondary to urinary retention.  He will follow-up in clinic in 1 week with a renal ultrasound  BPH with lower urinary tract symptoms including weak stream-- Flomax and finasteride to be continued at discharge.  Bladder stone--to be addressed at follow-up.  Will need cystolitholapaxy  UTI--staph aureus--recommend discharging with oral antibiotic  Stable from a urological standpoint for discharge.  Follow-up will be arranged for 1 week with a renal ultrasound and possible voiding trial.   Modena Slater, MD Urology 11/05/2018, 9:51 AM

## 2018-11-05 NOTE — Progress Notes (Signed)
PROGRESS NOTE    Timothy Walsh  ZOX:[id number] DOB: 01/20/1951 DOA: 11/03/2018 PCP: Patient, No Pcp Per   Brief Narrative:  68 year old with history of CAD, history of MI, diabetes mellitus type 2, essential hypertension came to the hospital with complains of urinary burning and hematuria.  On CT of the abdomen was noted to have bilateral hydroureteronephrosis with renal stone and enlarged prostate.  Urology was consulted.  IV Rocephin started.Recommended to start Flomax and finasteride with possible need for outpatient stone removal procedure.  Urine culture ended growing staph aureus susceptible to multiple antibiotics.  Initially started on vancomycin later switched.   Assessment & Plan:   Active Problems:   Complicated UTI (urinary tract infection)   Sepsis due to urinary tract infection (HCC)   Calculus of urinary bladder   Bilateral hydronephrosis   DKA (diabetic ketoacidoses) (HCC)   DM II (diabetes mellitus, type II), controlled (HCC)   Hematuria   Chronic anticoagulation   CAD (coronary artery disease)   Essential hypertension  Bilateral hydronephrosis secondary to bladder stone and gross hematuria Benign prostatic hyperplasia MSSA urinary tract infection - Foley catheter in place.  Continue Flomax and finasteride.  Okay to resume Xarelto per urology and follow-up outpatient in 1 week with renal ultrasound. -Urine culture growing MSSA.  Will discontinue vancomycin and Rocephin.  Start patient on amoxicillin. -Supportive care -Cleared to resume blood thinner, resume Xarelto -Appreciate urology input  History of coronary artery disease -Currently patient is chest pain-free.  Continue aspirin.  Diabetes mellitus type 2, uncontrolled -Insulin sliding scale and Accu-Chek.  Metformin on hold.  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  Consultants:   Urology  Procedures:   None  Antimicrobials:   Rocephin day 2, stopped  Vancomycin day 1, stopped  Amoxicillin day 1   Subjective: Feels ok, some blood in his foley noted but improved. Reports of some burning sensation in his urine  Review of Systems Otherwise negative except as per HPI, including: General = no fevers, chills, dizziness, malaise, fatigue HEENT/EYES = negative for pain, redness, loss of vision, double vision, blurred vision, loss of hearing, sore throat, hoarseness, dysphagia Cardiovascular= negative for chest pain, palpitation, murmurs, lower extremity swelling Respiratory/lungs= negative for shortness of breath, cough, hemoptysis, wheezing, mucus production Gastrointestinal= negative for nausea, vomiting,, abdominal pain, melena, hematemesis Genitourinary= negative for Dysuria,Change in Urinary Frequency MSK = Negative for arthralgia, myalgias, Back Pain, Joint swelling  Neurology= Negative for headache, seizures, numbness, tingling  Psychiatry= Negative for anxiety, depression, suicidal and homocidal ideation Allergy/Immunology= Medication/Food allergy as listed  Skin= Negative for Rash, lesions, ulcers, itching   Objective: Vitals:   11/04/18 1924 11/05/18 0104 11/05/18 0620 11/05/18 0956  BP: (!) 147/81 (!) 167/83 (!) 152/83 (!) 143/79  Pulse: 83 84 81 87  Resp: Temp: 98 F (36.7 C) (!) 97.5 F (36.4 C) 98 F (36.7 C) 98.3 F (36.8 C)  TempSrc: Oral Oral Oral Oral  SpO2: 96% 97% 94% 93%  Weight:      Height:        Intake/Output Summary (Last 24 hours) at 11/05/2018 1142 Last data filed at 11/05/2018 0958 Gross per 24 hour  Intake 2525.18 ml  Output 3700 ml  Net -1174.82 ml   [employer]  11/04/18 0427  Weight: 92.9 kg    Examination: Constitutional: NAD, calm,  comfortable Eyes: PERRL, lids and conjunctivae normal ENMT: Mucous membranes are moist. Posterior pharynx clear of any exudate or lesions.Normal dentition.  Neck: normal, supple, no masses, no thyromegaly Respiratory: clear to auscultation bilaterally, no wheezing, no crackles. Normal respiratory effort. No accessory muscle use.  Cardiovascular: Regular rate and rhythm, no murmurs / rubs / gallops. No extremity edema. 2+ pedal pulses. No carotid bruits.  Abdomen: no tenderness, no masses palpated. No hepatosplenomegaly. Bowel sounds positive.  Musculoskeletal: no clubbing / cyanosis. No joint deformity upper and lower extremities. Good ROM, no contractures. Normal muscle tone.  Skin: no rashes, lesions, ulcers. No induration Neurologic: CN 2-12 grossly intact. Sensation intact, DTR normal. Strength 5/5 in all 4.  Psychiatric: Normal judgment and insight. Alert and oriented x 3. Normal mood.  Foley in place with some maroon blood.  Data Reviewed:   CBC: Recent Labs  Lab 11/03/18 0248 11/04/18 0536 11/05/18 0825  WBC 14.5* 11.6* 10.3  NEUTROABS 12.1*  --   --   HGB 15.2 11.2* 11.3*  HCT 47.1 36.4* 37.0*  MCV 89.2 92.6 93.2  PLT 217 153 165   Basic Metabolic Panel: Recent Labs  Lab 11/03/18 1153 11/03/18 1606 11/03/18 2001 11/04/18 0536 11/05/18 0511 11/05/18 0825  NA 136 138 136 137  --  135  K 3.9 3.8 3.6 3.6  --  3.2*  CL 112* 113* 111 112*  --  109  CO2 19* 19* 19* 19*  --  23  GLUCOSE 248* 238* 236* 175*  --  218*  BUN 16 16 15 15   --  15  CREATININE 1.27* 1.23 1.33* 1.22 1.13 1.09  CALCIUM 6.9* 7.2* 7.1* 7.1*  --  7.6*  MG  --   --   --   --   --  1.8   GFR: Estimated Creatinine Clearance: 75.1 mL/min (by C-G formula based on SCr of 1.09 mg/dL). Liver Function Tests: No results for input(s): AST, ALT, ALKPHOS, BILITOT, PROT, ALBUMIN in the last 168 hours. No results for input(s): LIPASE, AMYLASE in the last 168 hours. No results for input(s): AMMONIA in the last  168 hours. Coagulation Profile: Recent Labs  Lab 11/03/18 0248 11/04/18 0536  INR 1.0 1.3*   Cardiac Enzymes: Recent Labs  Lab 11/03/18 0249  TROPONINI <0.03   BNP (last 3 results) No results for input(s): PROBNP in the last 8760 hours. HbA1C: No results for input(s): HGBA1C in the last 72 hours. CBG: Recent Labs  Lab 11/04/18 0731 11/04/18 1149 11/04/18 1637 11/04/18 2216 11/05/18 0814  GLUCAP 169* 253* 266* 273* 185*   Lipid Profile: No results for input(s): CHOL, HDL, LDLCALC, TRIG, CHOLHDL, LDLDIRECT in the last 72 hours. Thyroid Function Tests: No results for input(s): TSH, T4TOTAL, FREET4, T3FREE, THYROIDAB in the last 72 hours. Anemia Panel: No results for input(s): VITAMINB12, FOLATE, FERRITIN, TIBC, IRON, RETICCTPCT in the last 72 hours. Sepsis Labs: Recent Labs  Lab 11/03/18 6294  LATICACIDVEN 2.4*    Recent Results (from the past 240 hour(s))  Urine Culture     Status: Abnormal   Collection Time: 11/03/18  2:48 AM  Result Value Ref Range Status   Specimen Description   Final    URINE, CLEAN CATCH Performed at [facility], [address] [place], [place] [address]    Special Requests   Final    NONE Performed at [facility], [address]  [place], [place] [address]    Culture >=100,000 COLONIES/mL STAPHYLOCOCCUS AUREUS (A)  Final   Report Status 11/05/2018 FINAL  Final   Organism ID, Bacteria STAPHYLOCOCCUS AUREUS (A)  Final      Susceptibility   Staphylococcus aureus - MIC*    CIPROFLOXACIN 2 INTERMEDIATE Intermediate     GENTAMICIN <=0.5 SENSITIVE Sensitive     NITROFURANTOIN <=16 SENSITIVE Sensitive     OXACILLIN <=0.25 SENSITIVE Sensitive     TETRACYCLINE <=1 SENSITIVE Sensitive     VANCOMYCIN <=0.5 SENSITIVE Sensitive     TRIMETH/SULFA <=10 SENSITIVE Sensitive     CLINDAMYCIN <=0.25 SENSITIVE Sensitive     RIFAMPIN <=0.5 SENSITIVE Sensitive     Inducible Clindamycin NEGATIVE Sensitive     *  >=100,000 COLONIES/mL STAPHYLOCOCCUS AUREUS  Culture, blood (Routine X 2) w Reflex to ID Panel     Status: None (Preliminary result)   Collection Time: 11/03/18  6:29 AM  Result Value Ref Range Status   Specimen Description   Final    BLOOD RIGHT ANTECUBITAL Performed at [facility], [address], [place], [place] [address]    Special Requests   Final    BOTTLES DRAWN AEROBIC AND ANAEROBIC Blood Culture adequate volume Performed at [facility], [address], [place], [place] [address]    Culture   Final    NO GROWTH 2 DAYS Performed at [facility], [address] [place], [place] [address]    Report Status PENDING  Incomplete  Culture, blood (Routine X 2) w Reflex to ID Panel     Status: None (Preliminary result)   Collection Time: 11/03/18  6:29 AM  Result Value Ref Range Status   Specimen Description   Final    BLOOD LEFT ANTECUBITAL Performed at [facility], [address], [place], [place] [address]    Special Requests   Final    BOTTLES DRAWN AEROBIC AND ANAEROBIC Blood Culture adequate volume Performed at [facility], [address], [place], [place] [address]    Culture   Final    NO GROWTH 2 DAYS Performed at [facility], [address], [place], [place] [address]    Report Status PENDING  Incomplete         Radiology Studies: No results found.      Scheduled Meds: . aspirin  81 mg Oral Daily  . finasteride  5 mg Oral Daily  . insulin aspart  0-15 Units Subcutaneous TID WC  . insulin aspart  0-5 Units Subcutaneous QHS  . insulin glargine  10 Units Subcutaneous Daily  . multivitamin with minerals  1 tablet Oral Daily  . pentosan polysulfate  100 mg Oral TID  . tamsulosin  0.4 mg Oral QPC supper   Continuous Infusions: . sodium chloride 100 mL/hr at 11/05/18 0643  . cefTRIAXone (ROCEPHIN)  IV 1 g (11/04/18 2228)  . vancomycin 1,250 mg (11/05/18 0106)      LOS: 2 days   Time spent= 40 mins    Telicia Hodgkiss Joline Maxcy, MD [facility]  If 7PM-7AM, please contact night-coverage [email or web address] 11/05/2018, 11:42 AM

## 2018-11-06 LAB — CBC
HCT: 37.3 % — ABNORMAL LOW (ref 39.0–52.0)
Hemoglobin: 11.5 g/dL — ABNORMAL LOW (ref 13.0–17.0)
MCH: 28.5 pg (ref 26.0–34.0)
MCHC: 30.8 g/dL (ref 30.0–36.0)
MCV: 92.3 fL (ref 80.0–100.0)
Platelets: 200 10*3/uL (ref 150–400)
RBC: 4.04 MIL/uL — ABNORMAL LOW (ref 4.22–5.81)
RDW: 13.2 % (ref 11.5–15.5)
WBC: 7.8 10*3/uL (ref 4.0–10.5)
nRBC: 0 % (ref 0.0–0.2)

## 2018-11-06 LAB — MAGNESIUM: MAGNESIUM: 1.9 mg/dL (ref 1.7–2.4)

## 2018-11-06 LAB — BASIC METABOLIC PANEL
Anion gap: 6 (ref 5–15)
BUN: 19 mg/dL (ref 8–23)
CO2: 23 mmol/L (ref 22–32)
CREATININE: 1.29 mg/dL — AB (ref 0.61–1.24)
Calcium: 7.8 mg/dL — ABNORMAL LOW (ref 8.9–10.3)
Chloride: 105 mmol/L (ref 98–111)
GFR calc Af Amer: >60 mL/min (ref >60)
GFR calc non Af Amer: 57 mL/min — ABNORMAL LOW (ref >60)
GLUCOSE: 288 mg/dL — AB (ref 70–99)
Potassium: 4 mmol/L (ref 3.5–5.1)
Sodium: 134 mmol/L — ABNORMAL LOW (ref 135–145)

## 2018-11-06 LAB — GLUCOSE, CAPILLARY
Glucose-Capillary: 259 mg/dL — ABNORMAL HIGH (ref 70–99)
Glucose-Capillary: 195 mg/dL — ABNORMAL HIGH (ref 70–99)
Glucose-Capillary: 197 mg/dL — ABNORMAL HIGH (ref 70–99)

## 2018-11-06 MED ORDER — SODIUM CHLORIDE 0.9 % IR SOLN
3000.0000 mL | Status: DC
  Administered 2018-11-06 – 2018-11-08 (×8): 3000 mL

## 2018-11-06 MED ORDER — AMOXICILLIN 500 MG PO CAPS: 500.0000 mg | ORAL_CAPSULE | Freq: Three times a day (TID) | ORAL | 0 refills | Status: DC

## 2018-11-06 MED ORDER — ZOLPIDEM TARTRATE 5 MG PO TABS: 5.0000 mg | ORAL_TABLET | Freq: Every evening | ORAL | Status: DC | PRN

## 2018-11-06 MED ORDER — PENTOSAN POLYSULFATE SODIUM 100 MG PO CAPS: 100.0000 mg | ORAL_CAPSULE | Freq: Three times a day (TID) | ORAL | 0 refills | Status: AC

## 2018-11-06 MED ORDER — TAMSULOSIN HCL 0.4 MG PO CAPS: 0.4000 mg | ORAL_CAPSULE | Freq: Every day | ORAL | 0 refills | Status: AC

## 2018-11-06 MED ORDER — FINASTERIDE 5 MG PO TABS: 5.0000 mg | ORAL_TABLET | Freq: Every day | ORAL | 0 refills | Status: AC

## 2018-11-06 NOTE — Progress Notes (Signed)
PROGRESS NOTE    Timothy Walsh  IOE:[id number] DOB: [birth year] DOA: 11/03/2018 PCP: Patient, No Pcp Per   Brief Narrative:  68 year old with history of CAD, history of MI, diabetes mellitus type 2, essential hypertension came to the hospital with complains of urinary burning and hematuria.  On CT of the abdomen was noted to have bilateral hydroureteronephrosis with renal stone and enlarged prostate.  Urology was consulted.  IV Rocephin started.Recommended to start Flomax and finasteride with possible need for outpatient stone removal procedure.  Urine culture ended growing staph aureus susceptible to multiple antibiotics.  Initially started on vancomycin later switched to amoxicillin.  He continued to have bleeding therefore Xarelto was stopped and restarted on CBI's.   Assessment & Plan:   Active Problems:   Complicated UTI (urinary tract infection)   Sepsis due to urinary tract infection (HCC)   Calculus of urinary bladder   Bilateral hydronephrosis   DKA (diabetic ketoacidoses) (HCC)   DM II (diabetes mellitus, type II), controlled (HCC)   Hematuria   Chronic anticoagulation   CAD (coronary artery disease)   Essential hypertension  Bilateral hydronephrosis secondary to bladder stone and gross hematuria, persistent Benign prostatic hyperplasia MSSA urinary tract infection - Foley catheter in place.  Continue Flomax and finasteride.  Discontinue Xarelto.  Start patient on continuous bladder irrigation at this time. -Continue amoxicillin orally for MSSA in urine. -Urology to see the patient again today for further input. Appreciate their assistance in patient management.   History of coronary artery disease -Currently patient is chest pain-free.  Stop ASA for now as well.   Diabetes mellitus type 2, uncontrolled -Insulin sliding scale and Accu-Chek.  Metformin on hold.  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   DVT prophylaxis:  SCDs Code Status: Full code Family Communication: Wife at bedside Disposition Plan: Due to worsening of his hematuria we will plan on continue to keep him in the hospital for continuous bladder irrigation and closer monitoring.  Consultants:   Urology  Procedures:   None  Antimicrobials:   Rocephin day 2, stopped  Vancomycin day 1, stopped  Amoxicillin day 2   Subjective: Overnight patient continued to have worsening of his hematuria with passing of several blood clots.  This morning he was persistent.  Review of Systems Otherwise negative except as per HPI, including: General = no fevers, chills, dizziness, malaise, fatigue HEENT/EYES = negative for pain, redness, loss of vision, double vision, blurred vision, loss of hearing, sore throat, hoarseness, dysphagia Cardiovascular= negative for chest pain, palpitation, murmurs, lower extremity swelling Respiratory/lungs= negative for shortness of breath, cough, hemoptysis, wheezing, mucus production Gastrointestinal= negative for nausea, vomiting,, abdominal pain, melena, hematemesis Genitourinary= negative for Dysuria,  Change in Urinary Frequency MSK = Negative for arthralgia, myalgias, Back Pain, Joint swelling  Neurology= Negative for headache, seizures, numbness, tingling  Psychiatry= Negative for anxiety, depression, suicidal and homocidal ideation Allergy/Immunology= Medication/Food allergy as listed  Skin= Negative for Rash, lesions, ulcers, itching   Objective: Vitals:   11/05/18 2054 11/06/18 0019 11/06/18 0452 11/06/18 0954  BP: (!) 166/89 (!) 149/85 (!) 184/89 (!) 152/93  Pulse: 88 86 78 99  Resp: 20 18 20  (!) 21  Temp: 97.8 F (36.6 C) 97.6 F (36.4 C) 97.7 F (36.5 C) 98.4 F (36.9 C)  TempSrc: Oral Oral Oral Oral  SpO2: 92% 92% 97% 97%  Weight:      Height:        Intake/Output Summary (Last 24 hours) at  11/06/2018 1134 Last data filed at 11/06/2018 1115 Gross per 24 hour  Intake 2797.92 ml  Output  7600 ml  Net -4802.08 ml   [employer]   11/04/18 0427  Weight: 92.9 kg    Examination: Constitutional: NAD, calm, comfortable Eyes: PERRL, lids and conjunctivae normal ENMT: Mucous membranes are moist. Posterior pharynx clear of any exudate or lesions.Normal dentition.  Neck: normal, supple, no masses, no thyromegaly Respiratory: clear to auscultation bilaterally, no wheezing, no crackles. Normal respiratory effort. No accessory muscle use.  Cardiovascular: Regular rate and rhythm, no murmurs / rubs / gallops. No extremity edema. 2+ pedal pulses. No carotid bruits.  Abdomen: no tenderness, no masses palpated. No hepatosplenomegaly. Bowel sounds positive.  Musculoskeletal: no clubbing / cyanosis. No joint deformity upper and lower extremities. Good ROM, no contractures. Normal muscle tone.  Skin: no rashes, lesions, ulcers. No induration Neurologic: CN 2-12 grossly intact. Sensation intact, DTR normal. Strength 5/5 in all 4.  Psychiatric: Normal judgment and insight. Alert and oriented x 3. Normal mood.  Significant amount of hematuria in his Foley catheter with blood clots.  Data Reviewed:   CBC: Recent Labs  Lab 11/03/18 0248 11/04/18 0536 11/05/18 0825 11/06/18 0527  WBC 14.5* 11.6* 10.3 7.8  NEUTROABS 12.1*  --   --   --   HGB 15.2 11.2* 11.3* 11.5*  HCT 47.1 36.4* 37.0* 37.3*  MCV 89.2 92.6 93.2 92.3  PLT 217 153 165 200   Basic Metabolic Panel: Recent Labs  Lab 11/03/18 1606 11/03/18 2001 11/04/18 0536 11/05/18 0511 11/05/18 0825 11/06/18 0527  NA 138 136 137  --  135 134*  K 3.8 3.6 3.6  --  3.2* 4.0  CL 113* 111 112*  --  109 105  CO2 19* 19* 19*  --  23 23  GLUCOSE 238* 236* 175*  --  218* 288*  BUN 16 15 15   --  15 19  CREATININE 1.23 1.33* 1.22 1.13 1.09 1.29*  CALCIUM 7.2* 7.1* 7.1*  --  7.6* 7.8*  MG  --   --   --   --  1.8 1.9   GFR: Estimated Creatinine Clearance: 63.4 mL/min (A) (by C-G formula based on SCr of 1.29 mg/dL (H)). Liver  Function Tests: No results for input(s): AST, ALT, ALKPHOS, BILITOT, PROT, ALBUMIN in the last 168 hours. No results for input(s): LIPASE, AMYLASE in the last 168 hours. No results for input(s): AMMONIA in the last 168 hours. Coagulation Profile: Recent Labs  Lab 11/03/18 0248 11/04/18 0536  INR 1.0 1.3*   Cardiac Enzymes: Recent Labs  Lab 11/03/18 0249  TROPONINI <0.03   BNP (last 3 results) No results for input(s): PROBNP in the last 8760 hours. HbA1C: No results for input(s): HGBA1C in the last 72 hours. CBG: Recent Labs  Lab 11/04/18 2216 11/05/18 0814 11/05/18 1140 11/05/18 1714 11/06/18 1127  GLUCAP 273* 185* 247* 211* 259*   Lipid Profile: No results for input(s): CHOL, HDL, LDLCALC, TRIG, CHOLHDL, LDLDIRECT in the last 72 hours. Thyroid Function Tests: No results for input(s): TSH, T4TOTAL, FREET4, T3FREE, THYROIDAB in the last 72 hours. Anemia Panel: No results for input(s): VITAMINB12, FOLATE, FERRITIN, TIBC, IRON, RETICCTPCT in the last 72 hours. Sepsis Labs: Recent Labs  Lab 11/03/18 3710  LATICACIDVEN 2.4*    Recent Results (from the past 240 hour(s))  Urine Culture     Status: Abnormal   Collection Time: 11/03/18  2:48 AM  Result Value Ref Range Status   Specimen Description  Final    URINE, CLEAN CATCH Performed at [facility], [address] [place], [place] [address]    Special Requests   Final    NONE Performed at [facility], [address] [place], [place] [address]    Culture >=100,000 COLONIES/mL STAPHYLOCOCCUS AUREUS (A)  Final   Report Status 11/05/2018 FINAL  Final   Organism ID, Bacteria STAPHYLOCOCCUS AUREUS (A)  Final      Susceptibility   Staphylococcus aureus - MIC*    CIPROFLOXACIN 2 INTERMEDIATE Intermediate     GENTAMICIN <=0.5 SENSITIVE Sensitive     NITROFURANTOIN <=16 SENSITIVE Sensitive     OXACILLIN <=0.25 SENSITIVE Sensitive     TETRACYCLINE <=1 SENSITIVE Sensitive      VANCOMYCIN <=0.5 SENSITIVE Sensitive     TRIMETH/SULFA <=10 SENSITIVE Sensitive     CLINDAMYCIN <=0.25 SENSITIVE Sensitive     RIFAMPIN <=0.5 SENSITIVE Sensitive     Inducible Clindamycin NEGATIVE Sensitive     * >=100,000 COLONIES/mL STAPHYLOCOCCUS AUREUS  Culture, blood (Routine X 2) w Reflex to ID Panel     Status: None (Preliminary result)   Collection Time: 11/03/18  6:29 AM  Result Value Ref Range Status   Specimen Description   Final    BLOOD RIGHT ANTECUBITAL Performed at [facility], [address] [place], [place] [address]    Special Requests   Final    BOTTLES DRAWN AEROBIC AND ANAEROBIC Blood Culture adequate volume Performed at [facility], [address] [place], [place] [address]    Culture   Final    NO GROWTH 3 DAYS Performed at [facility], [address] [place], [place] [address]    Report Status PENDING  Incomplete  Culture, blood (Routine X 2) w Reflex to ID Panel     Status: None (Preliminary result)   Collection Time: 11/03/18  6:29 AM  Result Value Ref Range Status   Specimen Description   Final    BLOOD LEFT ANTECUBITAL Performed at [facility], [address] [place], [place] [address]    Special Requests   Final    BOTTLES DRAWN AEROBIC AND ANAEROBIC Blood Culture adequate volume Performed at [facility], [address] [place], [place] [address]    Culture   Final    NO GROWTH 3 DAYS Performed at [facility], [address] [place], [place] [address]    Report Status PENDING  Incomplete         Radiology Studies: No results found.      Scheduled Meds: . amoxicillin  500 mg Oral Q8H  . finasteride  5 mg Oral Daily  . insulin aspart  0-15 Units Subcutaneous TID WC  . insulin aspart  0-5 Units Subcutaneous QHS  . insulin glargine  10 Units Subcutaneous Daily  . multivitamin with minerals  1 tablet Oral Daily  . pentosan polysulfate   100 mg Oral TID  . tamsulosin  0.4 mg Oral QPC supper   Continuous Infusions: . sodium chloride 100 mL/hr at 11/06/18 0600     LOS: 3 days   Time spent= 35 mins     Joline Maxcyhirag , MD [facility]  If 7PM-7AM, please contact night-coverage [email or web address] 11/06/2018, 11:34 AM

## 2018-11-06 NOTE — Progress Notes (Signed)
Pt's urine has remained bloody throughout the night tonight. Foley draining with some clots noted. This morning, very large clot noted in foley bag. Bed pad saturated with blood. Pt continues to c/o burning sensation. Foley irrigated x1 with 30 cc per order. Urine draining at this time. Still a bloody color. Will continue to monitor.

## 2018-11-06 NOTE — Plan of Care (Signed)

## 2018-11-06 NOTE — Progress Notes (Signed)
Urology Inpatient Progress Report  Lower urinary tract infectious disease [N39.0] Gross hematuria [R31.0] Sepsis due to urinary tract infection (HCC) [A41.9, N39.0]   Intv/Subj: Had done well early yesterday morning after our exam although unfortunately re-developed gross hematuria within tubing. Some larger clots per nursing report We had restarted his home Xarelto yesterday He was placed back on CBI overnight Urine light pink in tubing on slow drip this morning Hgb stable at 11.5 from 11.3  Active Problems:   Complicated UTI (urinary tract infection)   Sepsis due to urinary tract infection (HCC)   Calculus of urinary bladder   Bilateral hydronephrosis   DKA (diabetic ketoacidoses) (HCC)   DM II (diabetes mellitus, type II), controlled (HCC)   Hematuria   Chronic anticoagulation   CAD (coronary artery disease)   Essential hypertension  Current Facility-Administered Medications  Medication Dose Route Frequency Provider Last Rate Last Dose  . 0.9 %  sodium chloride infusion   Intravenous Continuous [name], MD 100 mL/hr at 11/06/18 0600    . acetaminophen (TYLENOL) tablet 650 mg  650 mg Oral Q6H PRN [name], [name], DO   650 mg at 11/05/18 2127   Or  . acetaminophen (TYLENOL) suppository 650 mg  650 mg Rectal Q6H PRN [name], [name], DO      . amoxicillin (AMOXIL) capsule 500 mg  500 mg Oral Q8H Amin, Ankit Chirag, MD   500 mg at 11/06/18 0557  . cyclobenzaprine (FLEXERIL) tablet 10 mg  10 mg Oral TID PRN [name], [name], DO   10 mg at 11/06/18 0930  . finasteride (PROSCAR) tablet 5 mg  5 mg Oral Daily [name], MD   5 mg at 11/06/18 0347  . insulin aspart (novoLOG) injection 0-15 Units  0-15 Units Subcutaneous TID WC Schorr, [name], NP   5 Units at 11/06/18 [phone]  . insulin aspart (novoLOG) injection 0-5 Units  0-5 Units Subcutaneous QHS [name], MD   2 Units at 11/05/18 2128  . insulin glargine (LANTUS) injection 10 Units  10 Units Subcutaneous Daily [name], [name], DO   10  Units at 11/06/18 0928  . multivitamin with minerals tablet 1 tablet  1 tablet Oral Daily [name], [name], DO   1 tablet at 11/06/18 0929  . ondansetron (ZOFRAN) tablet 4 mg  4 mg Oral Q6H PRN [name], [name], DO       Or  . ondansetron (ZOFRAN) injection 4 mg  4 mg Intravenous Q6H PRN [name], [name], DO      . pentosan polysulfate (ELMIRON) capsule 100 mg  100 mg Oral TID [name], [name], DO   100 mg at 11/06/18 0929  . polyethylene glycol (MIRALAX / GLYCOLAX) packet 17 g  17 g Oral Daily PRN [name], [name], DO      . sorbitol 70 % solution 30 mL  30 mL Oral Daily PRN [name], [name], DO      . tamsulosin (FLOMAX) capsule 0.4 mg  0.4 mg Oral QPC supper [name], MD   0.4 mg at 11/05/18 1814  . traMADol (ULTRAM) tablet 50 mg  50 mg Oral Q6H PRN [name], [name], DO   50 mg at 11/06/18 0930     Objective: Vital: Vitals:   11/05/18 2054 11/06/18 0019 11/06/18 0452 11/06/18 0954  BP: (!) 166/89 (!) 149/85 (!) 184/89 (!) 152/93  Pulse: 88 86 78 99  Resp: 20 18 20  (!) 21  Temp: 97.8 F (36.6 C) 97.6 F (36.4 C) 97.7 F (36.5 C) 98.4 F (36.9 C)  TempSrc: Oral Oral Oral Oral  SpO2: 92% 92% 97% 97%  Weight:      Height:       I/Os: I/O last 3 completed shifts: In: 4334.7 [P.O.:490; I.V.:3496.3; IV Piggyback:348.4] Out: 7250 [Urine:7250]  Physical Exam:  General: Patient is in no apparent distress Lungs: Normal respiratory effort, chest expands symmetrically. GI: The abdomen is soft and nontender without mass. Foley: Three-way Foley catheter in place with CBI running at slow drip. Urine light pink in tubing this morning. Small red sediment but no clots in tubing Ext: lower extremities symmetric  Lab Results: Recent Labs    11/04/18 0536 11/05/18 0825 11/06/18 0527  WBC 11.6* 10.3 7.8  HGB 11.2* 11.3* 11.5*  HCT 36.4* 37.0* 37.3*   Recent Labs    11/04/18 0536 11/05/18 0511 11/05/18 0825 11/06/18 0527  NA 137  --  135 134*  K 3.6  --  3.2* 4.0  CL 112*  --  109 105  CO2 19*  --  23  23  GLUCOSE 175*  --  218* 288*  BUN 15  --  15 19  CREATININE 1.22 1.13 1.09 1.29*  CALCIUM 7.1*  --  7.6* 7.8*   Recent Labs    11/04/18 0536  INR 1.3*   No results for input(s): LABURIN in the last 72 hours. Results for orders placed or performed during the hospital encounter of 11/03/18  Urine Culture     Status: Abnormal   Collection Time: 11/03/18  2:48 AM  Result Value Ref Range Status   Specimen Description   Final    URINE, CLEAN CATCH Performed at [facility], [address] [place], [place] [address]    Special Requests   Final    NONE Performed at [facility], [address], [place], [place] [address]    Culture >=100,000 COLONIES/mL STAPHYLOCOCCUS AUREUS (A)  Final   Report Status 11/05/2018 FINAL  Final   Organism ID, Bacteria STAPHYLOCOCCUS AUREUS (A)  Final      Susceptibility   Staphylococcus aureus - MIC*    CIPROFLOXACIN 2 INTERMEDIATE Intermediate     GENTAMICIN <=0.5 SENSITIVE Sensitive     NITROFURANTOIN <=16 SENSITIVE Sensitive     OXACILLIN <=0.25 SENSITIVE Sensitive     TETRACYCLINE <=1 SENSITIVE Sensitive     VANCOMYCIN <=0.5 SENSITIVE Sensitive     TRIMETH/SULFA <=10 SENSITIVE Sensitive     CLINDAMYCIN <=0.25 SENSITIVE Sensitive     RIFAMPIN <=0.5 SENSITIVE Sensitive     Inducible Clindamycin NEGATIVE Sensitive     * >=100,000 COLONIES/mL STAPHYLOCOCCUS AUREUS  Culture, blood (Routine X 2) w Reflex to ID Panel     Status: None (Preliminary result)   Collection Time: 11/03/18  6:29 AM  Result Value Ref Range Status   Specimen Description   Final    BLOOD RIGHT ANTECUBITAL Performed at [facility], [address] [place], [place] [address]    Special Requests   Final    BOTTLES DRAWN AEROBIC AND ANAEROBIC Blood Culture adequate volume Performed at [facility], [address], [place], [place] [address]    Culture   Final    NO GROWTH 3 DAYS Performed at  [facility], [address], [place], [place] [address]    Report Status PENDING  Incomplete  Culture, blood (Routine X 2) w Reflex to ID Panel     Status: None (Preliminary result)   Collection Time: 11/03/18  6:29 AM  Result Value Ref Range Status   Specimen Description   Final    BLOOD LEFT ANTECUBITAL Performed at [facility], [address] [place], [place] [address]    Special Requests   Final    BOTTLES DRAWN AEROBIC AND ANAEROBIC Blood Culture adequate volume Performed at [facility], [address] [place], [place] [address]    Culture   Final    NO GROWTH 3 DAYS Performed at [facility], [address] [place], [place] [address]    Report Status PENDING  Incomplete    Studies/Results: No results found.  Assessment: Gross hematuria--improving.  Continue Foley catheter.  Can be discharged with Foley catheter but follow-up in 1 week.  Would hold Xarelto again today. Plan to continue CBI through today.   Bilateral hydronephrosis-- possibly secondary to urinary retention.  He will follow-up in clinic in 1 week with a renal ultrasound  BPH with lower urinary tract symptoms including weak stream-- Flomax and finasteride to be continued at discharge.  Bladder stone--to be addressed at follow-up.  Will need cystolitholapaxy  UTI--staph aureus--recommend discharging with oral antibiotic  Recommend observing in house through today.  Follow-up will be arranged for 1 week with a renal ultrasound and possible voiding trial.

## 2018-11-07 ENCOUNTER — Inpatient Hospital Stay (HOSPITAL_COMMUNITY): Payer: Medicare Other

## 2018-11-07 DIAGNOSIS — I82409 Acute embolism and thrombosis of unspecified deep veins of unspecified lower extremity: Secondary | ICD-10-CM

## 2018-11-07 LAB — GLUCOSE, CAPILLARY
GLUCOSE-CAPILLARY: 243 mg/dL — AB (ref 70–99)
Glucose-Capillary: 247 mg/dL — ABNORMAL HIGH (ref 70–99)
Glucose-Capillary: 184 mg/dL — ABNORMAL HIGH (ref 70–99)
Glucose-Capillary: 232 mg/dL — ABNORMAL HIGH (ref 70–99)
Glucose-Capillary: 229 mg/dL — ABNORMAL HIGH (ref 70–99)
Glucose-Capillary: 195 mg/dL — ABNORMAL HIGH (ref 70–99)

## 2018-11-07 LAB — CBC
HCT: 33.4 % — ABNORMAL LOW (ref 39.0–52.0)
Hemoglobin: 10.2 g/dL — ABNORMAL LOW (ref 13.0–17.0)
MCH: 28.3 pg (ref 26.0–34.0)
MCHC: 30.5 g/dL (ref 30.0–36.0)
MCV: 92.5 fL (ref 80.0–100.0)
Platelets: 207 10*3/uL (ref 150–400)
RBC: 3.61 MIL/uL — ABNORMAL LOW (ref 4.22–5.81)
RDW: 13.3 % (ref 11.5–15.5)
WBC: 7.6 10*3/uL (ref 4.0–10.5)
nRBC: 0 % (ref 0.0–0.2)

## 2018-11-07 LAB — BASIC METABOLIC PANEL
Anion gap: 6 (ref 5–15)
BUN: 15 mg/dL (ref 8–23)
CO2: 22 mmol/L (ref 22–32)
Calcium: 7.7 mg/dL — ABNORMAL LOW (ref 8.9–10.3)
Chloride: 109 mmol/L (ref 98–111)
Creatinine, Ser: 1.07 mg/dL (ref 0.61–1.24)
GFR calc Af Amer: >60 mL/min (ref >60)
GFR calc non Af Amer: >60 mL/min (ref >60)
Glucose, Bld: 213 mg/dL — ABNORMAL HIGH (ref 70–99)
Potassium: 3.7 mmol/L (ref 3.5–5.1)
Sodium: 137 mmol/L (ref 135–145)

## 2018-11-07 LAB — MAGNESIUM: Magnesium: 1.7 mg/dL (ref 1.7–2.4)

## 2018-11-07 MED ORDER — BELLADONNA ALKALOIDS-OPIUM 16.2-60 MG RE SUPP
1.0000 | Freq: Once | RECTAL | Status: AC
  Administered 2018-11-07: 1 via RECTAL
  Filled 2018-11-07: qty 1

## 2018-11-07 MED ORDER — MORPHINE SULFATE (PF) 2 MG/ML IV SOLN
2.0000 mg | INTRAVENOUS | Status: DC | PRN
  Administered 2018-11-07: 2 mg via INTRAVENOUS
  Filled 2018-11-07: qty 1

## 2018-11-07 NOTE — Progress Notes (Signed)
Bilateral lower extremity venous duplex has been completed. Preliminary results can be found in CV Proc through chart review.  Results were given to the patient's nurse, Pam.  11/07/18 1:44 PM Olen Cordial RVT

## 2018-11-07 NOTE — Progress Notes (Addendum)
CBI has been running at a moderate speed throughout the night tonight. This morning, urine noted to be darker in color and pt c/o a "cramping" feeling. No clots noted. Rate increased to wide open clamp. Urine starting to clear. Cramping feeling beginning to subside after PRN medication and increased rate. Will continue to monitor closely.

## 2018-11-07 NOTE — Care Management Important Message (Signed)
Important Message  Patient Details  Name: Timothy Walsh MRN: [id number] Date of Birth: 11/28/1950   Medicare Important Message Given:  Yes    Tenicia Gural 11/07/2018, 8:53 AM

## 2018-11-07 NOTE — Plan of Care (Signed)

## 2018-11-07 NOTE — Progress Notes (Signed)
PROGRESS NOTE    Timothy Walsh  ZVJ:[id number] DOB: May 23, 1951 DOA: 11/03/2018 PCP: Patient, No Pcp Per   Brief Narrative:  68 year old with history of CAD, history of MI, diabetes mellitus type 2, essential hypertension came to the hospital with complains of urinary burning and hematuria.  On CT of the abdomen was noted to have bilateral hydroureteronephrosis with renal stone and enlarged prostate.  Urology was consulted.  IV Rocephin started.Recommended to start Flomax and finasteride with possible need for outpatient stone removal procedure.  Urine culture ended growing staph aureus susceptible to multiple antibiotics.  Initially started on vancomycin later switched to amoxicillin.  He continued to have bleeding therefore Xarelto was stopped and restarted on CBI's.   Assessment & Plan:   Active Problems:   Complicated UTI (urinary tract infection)   Sepsis due to urinary tract infection (HCC)   Calculus of urinary bladder   Bilateral hydronephrosis   DKA (diabetic ketoacidoses) (HCC)   DM II (diabetes mellitus, type II), controlled (HCC)   Hematuria   Chronic anticoagulation   CAD (coronary artery disease)   Essential hypertension  Bilateral hydronephrosis secondary to bladder stone and gross hematuria, persistent Benign prostatic hyperplasia MSSA urinary tract infection - Foley in place, continues to have some hematuria.  Flomax and finasteride continued.  On continuous bladder irrigation. -Continue amoxicillin. - Patient seen by urology this morning, if no improvement by tomorrow he will need inpatient procedural intervention. -Continue to hold Xarelto  Acute blood loss anemia, normocytic - Baseline hemoglobin around 12.0.  This has trended down due to persistent hematuria.  Closely monitor this.  History of coronary artery disease -Currently patient is chest pain-free.  Stop ASA for now as well.   Diabetes mellitus type 2, uncontrolled -Insulin sliding scale and Accu-Chek.   Metformin on hold.  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  We will repeat lower extremity Dopplers to see if he even needs a long-term.  DVT prophylaxis: SCDs Code Status: Full code Family Communication: Wife at bedside Disposition Plan: Maintain hospital stay until his hematuria is improved.  Consultants:   Urology  Procedures:   None  Antimicrobials:   Rocephin day 2, stopped  Vancomycin day 1, stopped  Amoxicillin day 3   Subjective: Continues to have some hematuria overnight.  On CBI.  This morning unable to drain the catheter therefore urology adjusted.  Review of Systems Otherwise negative except as per HPI, including: General = no fevers, chills, dizziness, malaise, fatigue HEENT/EYES = negative for pain, redness, loss of vision, double vision, blurred vision, loss of hearing, sore throat, hoarseness, dysphagia Cardiovascular= negative for chest pain, palpitation, murmurs, lower extremity swelling Respiratory/lungs= negative for shortness of breath, cough, hemoptysis, wheezing, mucus production Gastrointestinal= negative for nausea, vomiting,, abdominal pain, melena, hematemesis Genitourinary= negative for Dysuria,  Change in Urinary Frequency MSK = Negative for arthralgia, myalgias, Back Pain, Joint swelling  Neurology= Negative for headache, seizures, numbness, tingling  Psychiatry= Negative for anxiety, depression, suicidal and homocidal ideation Allergy/Immunology= Medication/Food allergy as listed  Skin= Negative for Rash, lesions, ulcers, itching   Objective: Vitals:   11/06/18 1736 11/06/18 2146 11/07/18 0114 11/07/18 0535  BP: 129/76 (!) 144/76 (!) 144/85 (!) 155/89  Pulse: 87 77 84 91  Resp: 17 18 19 19   Temp: 97.8 F (36.6 C) 98.3 F (36.8 C) 99.2 F (37.3 C) 98 F (36.7 C)  TempSrc: Oral Oral Oral Oral  SpO2: 99% 99% 95% 97%  Weight:  Height:        Intake/Output Summary (Last 24  hours) at 11/07/2018 1125 Last data filed at 11/07/2018 1021 Gross per 24 hour  Intake 21842.67 ml  Output 8657818900 ml  Net 2942.67 ml   Filed Weights   11/04/18 0427  Weight: 92.9 kg    Examination: Constitutional: NAD, calm, comfortable Eyes: PERRL, lids and conjunctivae normal ENMT: Mucous membranes are moist. Posterior pharynx clear of any exudate or lesions.Normal dentition.  Neck: normal, supple, no masses, no thyromegaly Respiratory: clear to auscultation bilaterally, no wheezing, no crackles. Normal respiratory effort. No accessory muscle use.  Cardiovascular: Regular rate and rhythm, no murmurs / rubs / gallops. No extremity edema. 2+ pedal pulses. No carotid bruits.  Abdomen: no tenderness, no masses palpated. No hepatosplenomegaly. Bowel sounds positive.  Musculoskeletal: no clubbing / cyanosis. No joint deformity upper and lower extremities. Good ROM, no contractures. Normal muscle tone.  Skin: no rashes, lesions, ulcers. No induration Neurologic: CN 2-12 grossly intact. Sensation intact, DTR normal. Strength 5/5 in all 4.  Psychiatric: Normal judgment and insight. Alert and oriented x 3. Normal mood.   Foley catheter has dark maroon appearing urine/blood in it.  Gross hematuria.  Data Reviewed:   CBC: Recent Labs  Lab 11/03/18 0248 11/04/18 0536 11/05/18 0825 11/06/18 0527 11/07/18 0458  WBC 14.5* 11.6* 10.3 7.8 7.6  NEUTROABS 12.1*  --   --   --   --   HGB 15.2 11.2* 11.3* 11.5* 10.2*  HCT 47.1 36.4* 37.0* 37.3* 33.4*  MCV 89.2 92.6 93.2 92.3 92.5  PLT 217 153 165 200 207   Basic Metabolic Panel: Recent Labs  Lab 11/03/18 2001 11/04/18 0536 11/05/18 0511 11/05/18 0825 11/06/18 0527 11/07/18 0458  NA 136 137  --  135 134* 137  K 3.6 3.6  --  3.2* 4.0 3.7  CL 111 112*  --  109 105 109  CO2 19* 19*  --  23 23 22   GLUCOSE 236* 175*  --  218* 288* 213*  BUN 15 15  --  15 19 15   CREATININE 1.33* 1.22 1.13 1.09 1.29* 1.07  CALCIUM 7.1* 7.1*  --  7.6* 7.8*  7.7*  MG  --   --   --  1.8 1.9 1.7   GFR: Estimated Creatinine Clearance: 76.5 mL/min (by C-G formula based on SCr of 1.07 mg/dL). Liver Function Tests: No results for input(s): AST, ALT, ALKPHOS, BILITOT, PROT, ALBUMIN in the last 168 hours. No results for input(s): LIPASE, AMYLASE in the last 168 hours. No results for input(s): AMMONIA in the last 168 hours. Coagulation Profile: Recent Labs  Lab 11/03/18 0248 11/04/18 0536  INR 1.0 1.3*   Cardiac Enzymes: Recent Labs  Lab 11/03/18 0249  TROPONINI <0.03   BNP (last 3 results) No results for input(s): PROBNP in the last 8760 hours. HbA1C: No results for input(s): HGBA1C in the last 72 hours. CBG: Recent Labs  Lab 11/06/18 0718 11/06/18 1127 11/06/18 1644 11/06/18 2149 11/07/18 0727  GLUCAP 243* 259* 195* 197* 184*   Lipid Profile: No results for input(s): CHOL, HDL, LDLCALC, TRIG, CHOLHDL, LDLDIRECT in the last 72 hours. Thyroid Function Tests: No results for input(s): TSH, T4TOTAL, FREET4, T3FREE, THYROIDAB in the last 72 hours. Anemia Panel: No results for input(s): VITAMINB12, FOLATE, FERRITIN, TIBC, IRON, RETICCTPCT in the last 72 hours. Sepsis Labs: Recent Labs  Lab 11/03/18 0629  LATICACIDVEN 2.4*    Recent Results (from the past 240 hour(s))  Urine Culture  Status: Abnormal   Collection Time: 11/03/18  2:48 AM  Result Value Ref Range Status   Specimen Description   Final    URINE, CLEAN CATCH Performed at [facility], [address], [place], [place] [address]    Special Requests   Final    NONE Performed at [facility], [address] [place], [place] [address]    Culture >=100,000 COLONIES/mL STAPHYLOCOCCUS AUREUS (A)  Final   Report Status 11/05/2018 FINAL  Final   Organism ID, Bacteria STAPHYLOCOCCUS AUREUS (A)  Final      Susceptibility   Staphylococcus aureus - MIC*    CIPROFLOXACIN 2 INTERMEDIATE Intermediate     GENTAMICIN <=0.5 SENSITIVE  Sensitive     NITROFURANTOIN <=16 SENSITIVE Sensitive     OXACILLIN <=0.25 SENSITIVE Sensitive     TETRACYCLINE <=1 SENSITIVE Sensitive     VANCOMYCIN <=0.5 SENSITIVE Sensitive     TRIMETH/SULFA <=10 SENSITIVE Sensitive     CLINDAMYCIN <=0.25 SENSITIVE Sensitive     RIFAMPIN <=0.5 SENSITIVE Sensitive     Inducible Clindamycin NEGATIVE Sensitive     * >=100,000 COLONIES/mL STAPHYLOCOCCUS AUREUS  Culture, blood (Routine X 2) w Reflex to ID Panel     Status: None (Preliminary result)   Collection Time: 11/03/18  6:29 AM  Result Value Ref Range Status   Specimen Description   Final    BLOOD RIGHT ANTECUBITAL Performed at [place], [address] [place], [place] [address]    Special Requests   Final    BOTTLES DRAWN AEROBIC AND ANAEROBIC Blood Culture adequate volume Performed at [facility], [address], [place], [place] [address]    Culture   Final    NO GROWTH 4 DAYS Performed at [facility], [address] [place], [place] [address]    Report Status PENDING  Incomplete  Culture, blood (Routine X 2) w Reflex to ID Panel     Status: None (Preliminary result)   Collection Time: 11/03/18  6:29 AM  Result Value Ref Range Status   Specimen Description   Final    BLOOD LEFT ANTECUBITAL Performed at [facility], [address], [place], [place] [address]    Special Requests   Final    BOTTLES DRAWN AEROBIC AND ANAEROBIC Blood Culture adequate volume Performed at [facility], [address], [place], [place] [address]    Culture   Final    NO GROWTH 4 DAYS Performed at [facility], [address], [place], [place] [address]    Report Status PENDING  Incomplete         Radiology Studies: No results found.      Scheduled Meds: . amoxicillin  500 mg Oral Q8H  . finasteride  5 mg Oral Daily  . insulin aspart  0-15 Units Subcutaneous TID WC  . insulin aspart  0-5 Units  Subcutaneous QHS  . insulin glargine  10 Units Subcutaneous Daily  . multivitamin with minerals  1 tablet Oral Daily  . pentosan polysulfate  100 mg Oral TID  . tamsulosin  0.4 mg Oral QPC supper   Continuous Infusions: . sodium chloride 100 mL/hr at 11/07/18 0933  . sodium chloride irrigation       LOS: 4 days   Time spent= 35 mins    Dominique Calvey Joline Maxcy, MD [facility]  If 7PM-7AM, please contact night-coverage [email or web address] 11/07/2018, 11:25 AM

## 2018-11-07 NOTE — Progress Notes (Signed)
Subjective: He developed gross hematuria again yesterday and CBI restarted.  This morning, patient reports catheter not draining. I saw him about 10 AM this morning on rounds.  Nurse also tried to irrigate the catheter and could not get anything back.  Objective: Vital signs in last 24 hours: Temp:  [97.8 F (36.6 C)-99.2 F (37.3 C)] 98 F (36.7 C) (03/09 0535) Pulse Rate:  [77-91] 91 (03/09 0535) Resp:  [17-19] 19 (03/09 0535) BP: (129-155)/(70-89) 155/89 (03/09 0535) SpO2:  [95 %-99 %] 97 % (03/09 0535)  Intake/Output from previous day: 03/08 0701 - 03/09 0700 In: 20992.7 [P.O.:600; I.V.:2392.7] Out: 66440 [Urine:20575] Intake/Output this shift: Total I/O In: 240 [P.O.:240] Out: 2425 [Urine:2425]  Physical Exam:  NAD He is watching TV and overall looks relatively comfortable GU-on exam the catheter is obviously too far out.  Procedure: The balloon was deflated and he immediately began to void around the catheter.  The catheter was mobile and advanced back into the bladder and the balloon filled up to about 70 mL and seated back at the bladder neck.  Catheter was irrigated and irrigated to pink rather quickly.  No clots.  Lab Results: Recent Labs    11/05/18 0825 11/06/18 0527 11/07/18 0458  HGB 11.3* 11.5* 10.2*  HCT 37.0* 37.3* 33.4*   BMET Recent Labs    11/06/18 0527 11/07/18 0458  NA 134* 137  K 4.0 3.7  CL 105 109  CO2 23 22  GLUCOSE 288* 213*  BUN 19 15  CREATININE 1.29* 1.07  CALCIUM 7.8* 7.7*   No results for input(s): LABPT, INR in the last 72 hours. No results for input(s): LABURIN in the last 72 hours. Results for orders placed or performed during the hospital encounter of 11/03/18  Urine Culture     Status: Abnormal   Collection Time: 11/03/18  2:48 AM  Result Value Ref Range Status   Specimen Description   Final    URINE, CLEAN CATCH Performed at [facility], [address], [place], [place] [address]    Special  Requests   Final    NONE Performed at [facility], [address] [place], [place] [address]    Culture >=100,000 COLONIES/mL STAPHYLOCOCCUS AUREUS (A)  Final   Report Status 11/05/2018 FINAL  Final   Organism ID, Bacteria STAPHYLOCOCCUS AUREUS (A)  Final      Susceptibility   Staphylococcus aureus - MIC*    CIPROFLOXACIN 2 INTERMEDIATE Intermediate     GENTAMICIN <=0.5 SENSITIVE Sensitive     NITROFURANTOIN <=16 SENSITIVE Sensitive     OXACILLIN <=0.25 SENSITIVE Sensitive     TETRACYCLINE <=1 SENSITIVE Sensitive     VANCOMYCIN <=0.5 SENSITIVE Sensitive     TRIMETH/SULFA <=10 SENSITIVE Sensitive     CLINDAMYCIN <=0.25 SENSITIVE Sensitive     RIFAMPIN <=0.5 SENSITIVE Sensitive     Inducible Clindamycin NEGATIVE Sensitive     * >=100,000 COLONIES/mL STAPHYLOCOCCUS AUREUS  Culture, blood (Routine X 2) w Reflex to ID Panel     Status: None (Preliminary result)   Collection Time: 11/03/18  6:29 AM  Result Value Ref Range Status   Specimen Description   Final    BLOOD RIGHT ANTECUBITAL Performed at [facility], [address], [place], [place] [address]    Special Requests   Final    BOTTLES DRAWN AEROBIC AND ANAEROBIC Blood Culture adequate volume Performed at [facility], [address], [place], [place] [address]  Culture   Final    NO GROWTH 4 DAYS Performed at [facility], [address] [place], [place] [address]    Report Status PENDING  Incomplete  Culture, blood (Routine X 2) w Reflex to ID Panel     Status: None (Preliminary result)   Collection Time: 11/03/18  6:29 AM  Result Value Ref Range Status   Specimen Description   Final    BLOOD LEFT ANTECUBITAL Performed at [facility], [address] [place], [place] [address]    Special Requests   Final    BOTTLES DRAWN AEROBIC AND ANAEROBIC Blood Culture adequate volume Performed at [facility], [address] [place], [place] [address]    Culture   Final    NO GROWTH 4 DAYS Performed at [facility], [address] [place], [place] [address]    Report Status PENDING  Incomplete    Studies/Results: No results found.  Assessment/Plan: #1 gross hematuria-This is likely from BPH and bladder stone.  Recurrent bleeding likely from the balloon getting pulled down into the prostatic urethra.  It seems to be settling down quickly but if it doesn't discussed with the patient and his wife the nature of risks benefits and alternatives to cystoscopy, cystolitholapaxy with the laser, possible TURP, possible TURBT.  #2 bilateral hydronephrosis-this is likely from urinary retention and reflux. UOP good and Cr returned to normal.   #3 BPH with weak stream and likely overflow incontinence- tamsulosin and finasteride started.   #4bladder stone- will need laser cystolitholopaxy versus bladder stone removal and robotic simple.  #5UTI-Culture grew greater than 100,000 staph aureus. He is on amoxicillin.   LOS: 4 days   [name] 11/07/2018, 10:38 AM

## 2018-11-08 ENCOUNTER — Other Ambulatory Visit: Payer: Self-pay | Admitting: Urology

## 2018-11-08 ENCOUNTER — Inpatient Hospital Stay (HOSPITAL_COMMUNITY): Payer: Medicare Other | Admitting: Anesthesiology

## 2018-11-08 ENCOUNTER — Inpatient Hospital Stay (HOSPITAL_COMMUNITY): Payer: Medicare Other

## 2018-11-08 ENCOUNTER — Ambulatory Visit: Admit: 2018-11-08 | Payer: Medicare Other | Admitting: Urology

## 2018-11-08 ENCOUNTER — Encounter (HOSPITAL_COMMUNITY)
Admission: EM | Disposition: A | Discharge status: Home / Self Care | Payer: Self-pay | Source: Home / Self Care | Attending: Internal Medicine

## 2018-11-08 ENCOUNTER — Encounter (HOSPITAL_COMMUNITY): Payer: Self-pay | Admitting: *Deleted

## 2018-11-08 HISTORY — PX: CYSTOSCOPY WITH LITHOLAPAXY: SHX1425

## 2018-11-08 LAB — BASIC METABOLIC PANEL
Anion gap: 7 (ref 5–15)
BUN: 16 mg/dL (ref 8–23)
CO2: 24 mmol/L (ref 22–32)
Calcium: 8 mg/dL — ABNORMAL LOW (ref 8.9–10.3)
Chloride: 106 mmol/L (ref 98–111)
Creatinine, Ser: 1.14 mg/dL (ref 0.61–1.24)
GFR calc Af Amer: >60 mL/min (ref >60)
GFR calc non Af Amer: >60 mL/min (ref >60)
Glucose, Bld: 262 mg/dL — ABNORMAL HIGH (ref 70–99)
POTASSIUM: 3.6 mmol/L (ref 3.5–5.1)
SODIUM: 137 mmol/L (ref 135–145)

## 2018-11-08 LAB — CULTURE, BLOOD (ROUTINE X 2)
Culture: NO GROWTH
Culture: NO GROWTH
Special Requests: ADEQUATE
Special Requests: ADEQUATE

## 2018-11-08 LAB — CBC
HCT: 31.7 % — ABNORMAL LOW (ref 39.0–52.0)
Hemoglobin: 9.7 g/dL — ABNORMAL LOW (ref 13.0–17.0)
MCH: 28.4 pg (ref 26.0–34.0)
MCHC: 30.6 g/dL (ref 30.0–36.0)
MCV: 93 fL (ref 80.0–100.0)
Platelets: 250 10*3/uL (ref 150–400)
RBC: 3.41 MIL/uL — ABNORMAL LOW (ref 4.22–5.81)
RDW: 13.2 % (ref 11.5–15.5)
WBC: 9.4 10*3/uL (ref 4.0–10.5)
nRBC: 0 % (ref 0.0–0.2)

## 2018-11-08 LAB — GLUCOSE, CAPILLARY
Glucose-Capillary: 216 mg/dL — ABNORMAL HIGH (ref 70–99)
Glucose-Capillary: 139 mg/dL — ABNORMAL HIGH (ref 70–99)
Glucose-Capillary: 119 mg/dL — ABNORMAL HIGH (ref 70–99)
Glucose-Capillary: 119 mg/dL — ABNORMAL HIGH (ref 70–99)
Glucose-Capillary: 193 mg/dL — ABNORMAL HIGH (ref 70–99)

## 2018-11-08 LAB — MAGNESIUM: Magnesium: 1.7 mg/dL (ref 1.7–2.4)

## 2018-11-08 LAB — SURGICAL PCR SCREEN
MRSA, PCR: NEGATIVE
Staphylococcus aureus: POSITIVE — AB

## 2018-11-08 SURGERY — CYSTOSCOPY, WITH BLADDER CALCULUS LITHOLAPAXY
Anesthesia: General

## 2018-11-08 MED ORDER — EPHEDRINE SULFATE-NACL 50-0.9 MG/10ML-% IV SOSY
PREFILLED_SYRINGE | INTRAVENOUS | Status: DC | PRN
  Administered 2018-11-08: 10 mg via INTRAVENOUS

## 2018-11-08 MED ORDER — MIDAZOLAM HCL 2 MG/2ML IJ SOLN
INTRAMUSCULAR | Status: AC
  Filled 2018-11-08: qty 2

## 2018-11-08 MED ORDER — LEVOFLOXACIN IN D5W 500 MG/100ML IV SOLN: 500.0000 mg | Freq: Once | INTRAVENOUS | Status: DC

## 2018-11-08 MED ORDER — ONDANSETRON HCL 4 MG/2ML IJ SOLN
INTRAMUSCULAR | Status: AC
  Filled 2018-11-08: qty 2

## 2018-11-08 MED ORDER — INDIGOTINDISULFONATE SODIUM 8 MG/ML IJ SOLN
INTRAMUSCULAR | Status: AC
  Filled 2018-11-08: qty 5

## 2018-11-08 MED ORDER — PROPOFOL 10 MG/ML IV BOLUS
INTRAVENOUS | Status: AC
  Filled 2018-11-08: qty 20

## 2018-11-08 MED ORDER — FENTANYL CITRATE (PF) 100 MCG/2ML IJ SOLN
INTRAMUSCULAR | Status: AC
  Filled 2018-11-08: qty 2

## 2018-11-08 MED ORDER — ACETAMINOPHEN 500 MG PO TABS: 1000.0000 mg | ORAL_TABLET | Freq: Once | ORAL | Status: DC

## 2018-11-08 MED ORDER — SODIUM CHLORIDE 0.9 % IV SOLN
INTRAVENOUS | Status: DC | PRN
  Administered 2018-11-08: 25 ug/min via INTRAVENOUS

## 2018-11-08 MED ORDER — LACTATED RINGERS IV SOLN
INTRAVENOUS | Status: DC
  Administered 2018-11-08 (×2): via INTRAVENOUS

## 2018-11-08 MED ORDER — FENTANYL CITRATE (PF) 100 MCG/2ML IJ SOLN: 25.0000 ug | INTRAMUSCULAR | Status: DC | PRN

## 2018-11-08 MED ORDER — LIDOCAINE HCL (CARDIAC) PF 100 MG/5ML IV SOSY
PREFILLED_SYRINGE | INTRAVENOUS | Status: DC | PRN
  Administered 2018-11-08: 100 mg via INTRAVENOUS

## 2018-11-08 MED ORDER — SODIUM CHLORIDE 0.9 % IR SOLN
Status: DC | PRN
  Administered 2018-11-08: 27000 mL

## 2018-11-08 MED ORDER — PHENYLEPHRINE 40 MCG/ML (10ML) SYRINGE FOR IV PUSH (FOR BLOOD PRESSURE SUPPORT)
PREFILLED_SYRINGE | INTRAVENOUS | Status: AC
  Filled 2018-11-08: qty 10

## 2018-11-08 MED ORDER — INSULIN GLARGINE 100 UNIT/ML ~~LOC~~ SOLN
15.0000 [IU] | Freq: Every day | SUBCUTANEOUS | Status: DC
  Administered 2018-11-09: 15 [IU] via SUBCUTANEOUS
  Filled 2018-11-08 (×2): qty 0.15

## 2018-11-08 MED ORDER — PROPOFOL 10 MG/ML IV BOLUS
INTRAVENOUS | Status: DC | PRN
  Administered 2018-11-08 (×2): 20 mg via INTRAVENOUS
  Administered 2018-11-08: 10 mg via INTRAVENOUS
  Administered 2018-11-08: 150 mg via INTRAVENOUS

## 2018-11-08 MED ORDER — PHENYLEPHRINE 40 MCG/ML (10ML) SYRINGE FOR IV PUSH (FOR BLOOD PRESSURE SUPPORT)
PREFILLED_SYRINGE | INTRAVENOUS | Status: DC | PRN
  Administered 2018-11-08: 120 ug via INTRAVENOUS
  Administered 2018-11-08 (×2): 80 ug via INTRAVENOUS
  Administered 2018-11-08: 120 ug via INTRAVENOUS

## 2018-11-08 MED ORDER — 0.9 % SODIUM CHLORIDE (POUR BTL) OPTIME
TOPICAL | Status: DC | PRN
  Administered 2018-11-08: 1000 mL

## 2018-11-08 MED ORDER — INDIGOTINDISULFONATE SODIUM 8 MG/ML IJ SOLN
INTRAMUSCULAR | Status: DC | PRN
  Administered 2018-11-08: 5 mL via INTRAVENOUS

## 2018-11-08 MED ORDER — MIDAZOLAM HCL 5 MG/5ML IJ SOLN
INTRAMUSCULAR | Status: DC | PRN
  Administered 2018-11-08: 1 mg via INTRAVENOUS

## 2018-11-08 MED ORDER — LEVOFLOXACIN IN D5W 500 MG/100ML IV SOLN
500.0000 mg | INTRAVENOUS | Status: AC
  Administered 2018-11-08: 500 mg via INTRAVENOUS
  Filled 2018-11-08 (×2): qty 100

## 2018-11-08 MED ORDER — LIDOCAINE 2% (20 MG/ML) 5 ML SYRINGE
INTRAMUSCULAR | Status: AC
  Filled 2018-11-08: qty 5

## 2018-11-08 MED ORDER — PHENYLEPHRINE HCL 10 MG/ML IJ SOLN
INTRAMUSCULAR | Status: AC
  Filled 2018-11-08: qty 1

## 2018-11-08 MED ORDER — EPHEDRINE 5 MG/ML INJ
INTRAVENOUS | Status: AC
  Filled 2018-11-08: qty 10

## 2018-11-08 MED ORDER — ONDANSETRON HCL 4 MG/2ML IJ SOLN
INTRAMUSCULAR | Status: DC | PRN
  Administered 2018-11-08: 4 mg via INTRAVENOUS

## 2018-11-08 MED ORDER — FENTANYL CITRATE (PF) 100 MCG/2ML IJ SOLN
INTRAMUSCULAR | Status: DC | PRN
  Administered 2018-11-08: 25 ug via INTRAVENOUS
  Administered 2018-11-08: 50 ug via INTRAVENOUS
  Administered 2018-11-08: 25 ug via INTRAVENOUS
  Administered 2018-11-08: 50 ug via INTRAVENOUS
  Administered 2018-11-08 (×3): 25 ug via INTRAVENOUS
  Administered 2018-11-08: 50 ug via INTRAVENOUS
  Administered 2018-11-08: 25 ug via INTRAVENOUS

## 2018-11-08 SURGICAL SUPPLY — 18 items
BAG URO CATCHER STRL LF (MISCELLANEOUS) ×3 IMPLANT
CATH HEMA 3WAY 30CC 22FR COUDE (CATHETERS) ×3 IMPLANT
CATH URET 5FR 28IN CONE TIP (BALLOONS) ×2
CATH URET 5FR 70CM CONE TIP (BALLOONS) ×1 IMPLANT
CLOTH BEACON ORANGE TIMEOUT ST (SAFETY) ×3 IMPLANT
COVER WAND RF STERILE (DRAPES) IMPLANT
FIBER LASER FLEXIVA 1000 (UROLOGICAL SUPPLIES) ×3 IMPLANT
GLOVE BIOGEL M STRL SZ7.5 (GLOVE) ×3 IMPLANT
GOWN STRL REUS W/TWL LRG LVL3 (GOWN DISPOSABLE) ×3 IMPLANT
GOWN STRL REUS W/TWL XL LVL3 (GOWN DISPOSABLE) ×3 IMPLANT
GUIDEWIRE STR DUAL SENSOR (WIRE) ×3 IMPLANT
LOOP CUT BIPOLAR 24F LRG (ELECTROSURGICAL) ×3 IMPLANT
MANIFOLD NEPTUNE II (INSTRUMENTS) ×3 IMPLANT
PACK CYSTO (CUSTOM PROCEDURE TRAY) ×3 IMPLANT
STENT CONTOUR 6FRX26X.038 (STENTS) ×6 IMPLANT
SYRINGE IRR TOOMEY STRL 70CC (SYRINGE) ×3 IMPLANT
TUBING CONNECTING 10 (TUBING) ×2 IMPLANT
TUBING CONNECTING 10' (TUBING) ×1

## 2018-11-08 NOTE — Progress Notes (Signed)
Patient called out due to bladder feeling full, CBI had stopped dripping.  Foley irrigated with 30 cc of saline - 3 small clots resulted and foley now flowing well.  Will continue to monitor

## 2018-11-08 NOTE — Progress Notes (Signed)
PROGRESS NOTE    Timothy BoehringerLarry Walsh  NFA:[id number]:[id number] DOB: [birth year] DOA: 11/03/2018 PCP: Patient, No Pcp Per   Brief Narrative:  68 year old with history of CAD, history of MI, diabetes mellitus type 2, essential hypertension came to the hospital with complains of urinary burning and hematuria.  On CT of the abdomen was noted to have bilateral hydroureteronephrosis with renal stone and enlarged prostate.  Urology was consulted.  IV Rocephin started.Recommended to start Flomax and finasteride with possible need for outpatient stone removal procedure.  Urine culture ended growing staph aureus susceptible to multiple antibiotics.  Initially started on vancomycin later switched to amoxicillin.  He continued to have bleeding therefore Xarelto was stopped and restarted on CBI's.Repeat lower extremity Doppler showed acute DVT again.  Urology not planning on inpatient cystoscopy today   Assessment & Plan:   Active Problems:   Complicated UTI (urinary tract infection)   Sepsis due to urinary tract infection (HCC)   Calculus of urinary bladder   Bilateral hydronephrosis   DKA (diabetic ketoacidoses) (HCC)   DM II (diabetes mellitus, type II), controlled (HCC)   Hematuria   Chronic anticoagulation   CAD (coronary artery disease)   Essential hypertension  Bilateral hydronephrosis secondary to bladder stone and gross hematuria, persistent Benign prostatic hyperplasia MSSA urinary tract infection - Hematuria has slightly improved with CBI.  Continue Flomax and finasteride.  Urology plans on taking the patient to the OR today for cystoscopy.  Once cleared by their service, resume anticoagulation. -Continue amoxicillin.,  Pain control.  Further plan per their service after the procedure Intervention. -Repeating renal ultrasound today again. -Continue to hold Xarelto  Acute blood loss anemia, normocytic - This is trended down due to acute blood loss.  Closely monitor, no need for transfusion at this  time  History of coronary artery disease -Currently patient is chest pain-free.  Stop ASA for now as well.   Diabetes mellitus type 2, uncontrolled -Insulin sliding scale and Accu-Chek.  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  Unfortunately due to financial reason, will consider switching him over to Eliquis so he can at least get 1 month free supply which I have discussed with him and the case manager.  DVT prophylaxis: SCDs Code Status: Full code Family Communication: Wife at bedside Disposition Plan: Maintain hospital stay until his cystoscopy has been performed and we have further plan for him  Consultants:   Urology  Procedures:   None  Antimicrobials:   Rocephin day 2, stopped  Vancomycin day 1, stopped  Amoxicillin day 4   Subjective: With continuous bladder irrigation, his hematuria has slightly improved.  His pain is improved as well.  Review of Systems Otherwise negative except as per HPI, including: General = no fevers, chills, dizziness, malaise, fatigue HEENT/EYES = negative for pain, redness, loss of vision, double vision, blurred vision, loss of hearing, sore throat, hoarseness, dysphagia Cardiovascular= negative for chest pain, palpitation, murmurs, lower extremity swelling Respiratory/lungs= negative for shortness of breath, cough, hemoptysis, wheezing, mucus production Gastrointestinal= negative for nausea, vomiting,, abdominal pain, melena, hematemesis Genitourinary= negative for Dysuria, Hematuria, Change in Urinary Frequency MSK = Negative for arthralgia, myalgias, Back Pain, Joint swelling  Neurology= Negative for headache, seizures, numbness, tingling  Psychiatry= Negative for anxiety, depression, suicidal and homocidal ideation Allergy/Immunology= Medication/Food  allergy as listed  Skin= Negative for Rash, lesions, ulcers,  itching   Objective: Vitals:   11/07/18 0535 11/07/18 1243 11/07/18 2128 11/08/18 0451  BP: (!) 155/89 140/89 (!) 154/88 131/78  Pulse: 91 97 89 99  Resp: 19     Temp: 98 F (36.7 C) 98.3 F (36.8 C) 98.2 F (36.8 C) 98.2 F (36.8 C)  TempSrc: Oral Oral Oral Oral  SpO2: 97% (!) 89% 98% 94%  Weight:      Height:        Intake/Output Summary (Last 24 hours) at 11/08/2018 1218 Last data filed at 11/08/2018 4782 Gross per 24 hour  Intake 29647.89 ml  Output 95621 ml  Net 7922.89 ml   Filed Weights   11/04/18 0427  Weight: 92.9 kg    Examination: Constitutional: NAD, calm, comfortable Eyes: PERRL, lids and conjunctivae normal ENMT: Mucous membranes are moist. Posterior pharynx clear of any exudate or lesions.Normal dentition.  Neck: normal, supple, no masses, no thyromegaly Respiratory: clear to auscultation bilaterally, no wheezing, no crackles. Normal respiratory effort. No accessory muscle use.  Cardiovascular: Regular rate and rhythm, no murmurs / rubs / gallops. No extremity edema. 2+ pedal pulses. No carotid bruits.  Abdomen: no tenderness, no masses palpated. No hepatosplenomegaly. Bowel sounds positive.  Musculoskeletal: no clubbing / cyanosis. No joint deformity upper and lower extremities. Good ROM, no contractures. Normal muscle tone.  Skin: no rashes, lesions, ulcers. No induration Neurologic: CN 2-12 grossly intact. Sensation intact, DTR normal. Strength 5/5 in all 4.  Psychiatric: Normal judgment and insight. Alert and oriented x 3. Normal mood.  Foley catheter is in place with gross hematuria  Data Reviewed:   CBC: Recent Labs  Lab 11/03/18 0248 11/04/18 0536 11/05/18 0825 11/06/18 0527 11/07/18 0458 11/08/18 0538  WBC 14.5* 11.6* 10.3 7.8 7.6 9.4  NEUTROABS 12.1*  --   --   --   --   --   HGB 15.2 11.2* 11.3* 11.5* 10.2* 9.7*  HCT 47.1 36.4* 37.0* 37.3* 33.4* 31.7*  MCV 89.2 92.6 93.2  92.3 92.5 93.0  PLT 217 153 165 200 207 250   Basic Metabolic Panel: Recent Labs  Lab 11/04/18 0536 11/05/18 0511 11/05/18 0825 11/06/18 0527 11/07/18 0458 11/08/18 0538  NA 137  --  135 134* 137 137  K 3.6  --  3.2* 4.0 3.7 3.6  CL 112*  --  109 105 109 106  CO2 19*  --  GLUCOSE 175*  --  218* 288* 213* 262*  BUN 15  --  CREATININE 1.22 1.13 1.09 1.29* 1.07 1.14  CALCIUM 7.1*  --  7.6* 7.8* 7.7* 8.0*  MG  --   --  1.8 1.9 1.7 1.7   GFR: Estimated Creatinine Clearance: 71.8 mL/min (by C-G formula based on SCr of 1.14 mg/dL). Liver Function Tests: No results for input(s): AST, ALT, ALKPHOS, BILITOT, PROT, ALBUMIN in the last 168 hours. No results for input(s): LIPASE, AMYLASE in the last 168 hours. No results for input(s): AMMONIA in the last 168 hours. Coagulation Profile: Recent Labs  Lab 11/03/18 0248 11/04/18 0536  INR 1.0 1.3*   Cardiac Enzymes: Recent Labs  Lab 11/03/18 0249  TROPONINI <0.03   BNP (last 3 results) No results for input(s): PROBNP in the last 8760 hours. HbA1C: No results for input(s): HGBA1C in the last 72 hours. CBG: Recent Labs  Lab 11/07/18 1144 11/07/18 1638 11/07/18 2201 11/08/18 0729 11/08/18 1136  GLUCAP 232* 229* 195* 216* 139*   Lipid Profile:  No results for input(s): CHOL, HDL, LDLCALC, TRIG, CHOLHDL, LDLDIRECT in the last 72 hours. Thyroid Function Tests: No results for input(s): TSH, T4TOTAL, FREET4, T3FREE, THYROIDAB in the last 72 hours. Anemia Panel: No results for input(s): VITAMINB12, FOLATE, FERRITIN, TIBC, IRON, RETICCTPCT in the last 72 hours. Sepsis Labs: Recent Labs  Lab 11/03/18 8144  LATICACIDVEN 2.4*    Recent Results (from the past 240 hour(s))  Urine Culture     Status: Abnormal   Collection Time: 11/03/18  2:48 AM  Result Value Ref Range Status   Specimen Description   Final    URINE, CLEAN CATCH Performed at [facility], [address],  [place], [place] [address]    Special Requests   Final    NONE Performed at [facility], [address], [place], [place] [address]    Culture >=100,000 COLONIES/mL STAPHYLOCOCCUS AUREUS (A)  Final   Report Status 11/05/2018 FINAL  Final   Organism ID, Bacteria STAPHYLOCOCCUS AUREUS (A)  Final      Susceptibility   Staphylococcus aureus - MIC*    CIPROFLOXACIN 2 INTERMEDIATE Intermediate     GENTAMICIN <=0.5 SENSITIVE Sensitive     NITROFURANTOIN <=16 SENSITIVE Sensitive     OXACILLIN <=0.25 SENSITIVE Sensitive     TETRACYCLINE <=1 SENSITIVE Sensitive     VANCOMYCIN <=0.5 SENSITIVE Sensitive     TRIMETH/SULFA <=10 SENSITIVE Sensitive     CLINDAMYCIN <=0.25 SENSITIVE Sensitive     RIFAMPIN <=0.5 SENSITIVE Sensitive     Inducible Clindamycin NEGATIVE Sensitive     * >=100,000 COLONIES/mL STAPHYLOCOCCUS AUREUS  Culture, blood (Routine X 2) w Reflex to ID Panel     Status: None   Collection Time: 11/03/18  6:29 AM  Result Value Ref Range Status   Specimen Description   Final    BLOOD RIGHT ANTECUBITAL Performed at [facility], [address] [place], [place] [address]    Special Requests   Final    BOTTLES DRAWN AEROBIC AND ANAEROBIC Blood Culture adequate volume Performed at [facility], [address], [place], [place] [address]    Culture   Final    NO GROWTH 5 DAYS Performed at [facility], [address], [place], [place] [address]    Report Status 11/08/2018 FINAL  Final  Culture, blood (Routine X 2) w Reflex to ID Panel     Status: None   Collection Time: 11/03/18  6:29 AM  Result Value Ref Range Status   Specimen Description   Final    BLOOD LEFT ANTECUBITAL Performed at [facility], [address], [place], [place] [address]    Special Requests   Final    BOTTLES DRAWN AEROBIC AND ANAEROBIC Blood Culture adequate volume Performed at [facility], [address], [place], [place] [address]    Culture   Final    NO GROWTH 5 DAYS Performed at [facility], [address] [place], [place] [address]    Report Status 11/08/2018 FINAL  Final         Radiology Studies: US Renal  Result Date: 11/08/2018 CLINICAL DATA:  Hydronephrosis. EXAM: RENAL / URINARY TRACT ULTRASOUND COMPLETE COMPARISON:  CT scan of November 03, 2018. FINDINGS: Right Kidney: Renal measurements: 14.2 x 7.4 x 7.0 cm = volume: 384 mL . Echogenicity within normal limits. Severe hydronephrosis is noted. No mass visualized. Left Kidney: Renal measurements: 13.6 x 6.9 x 5.8 cm =  volume: 284 mL. Echogenicity within normal limits. Severe hydronephrosis is noted. No mass visualized. Bladder: Diffuse bladder wall thickening is noted. Large bladder calculus is noted. Foley catheter is noted. IMPRESSION: Severe bilateral hydronephrosis is noted. Large bladder calculus is noted with Foley catheter. Diffuse bladder wall thickening is noted and cystitis can not be excluded. Electronically Signed   By: Lupita Raider, M.D.   On: 11/08/2018 12:10   Vas [place] Lower Extremity Venous (dvt)  Result Date: 11/07/2018  Lower Venous Study Indications: History of DVT.  Performing Technologist: Chanda Busing RVT  Examination Guidelines: A complete evaluation includes B-mode imaging, spectral Doppler, color Doppler, and power Doppler as needed of all accessible portions of each vessel. Bilateral testing is considered an integral part of a complete examination. Limited examinations for reoccurring indications may be performed as noted.  Right Venous Findings: +---------+---------------+---------+-----------+----------+-----------------+          CompressibilityPhasicitySpontaneityPropertiesSummary           +---------+---------------+---------+-----------+----------+-----------------+ CFV      Full           Yes      Yes                                     +---------+---------------+---------+-----------+----------+-----------------+ SFJ      Full                                                           +---------+---------------+---------+-----------+----------+-----------------+ FV Prox  Partial                                      Age Indeterminate +---------+---------------+---------+-----------+----------+-----------------+ FV Mid   Partial                                      Age Indeterminate +---------+---------------+---------+-----------+----------+-----------------+ FV DistalPartial        Yes      Yes                  Age Indeterminate +---------+---------------+---------+-----------+----------+-----------------+ PFV      Full                                                           +---------+---------------+---------+-----------+----------+-----------------+ POP      Partial        Yes      Yes                  Acute             +---------+---------------+---------+-----------+----------+-----------------+ PTV      Full                                                           +---------+---------------+---------+-----------+----------+-----------------+  PERO     Full                                                           +---------+---------------+---------+-----------+----------+-----------------+  Left Venous Findings: +---------+---------------+---------+-----------+----------+-------+          CompressibilityPhasicitySpontaneityPropertiesSummary +---------+---------------+---------+-----------+----------+-------+ CFV      Full           Yes      Yes                          +---------+---------------+---------+-----------+----------+-------+ SFJ      Full                                                 +---------+---------------+---------+-----------+----------+-------+ FV Prox  Full                                                  +---------+---------------+---------+-----------+----------+-------+ FV Mid   Full                                                 +---------+---------------+---------+-----------+----------+-------+ FV DistalFull                                                 +---------+---------------+---------+-----------+----------+-------+ PFV      Full                                                 +---------+---------------+---------+-----------+----------+-------+ POP      Full           Yes      Yes                          +---------+---------------+---------+-----------+----------+-------+ PTV      Full                                                 +---------+---------------+---------+-----------+----------+-------+ PERO     Full                                                 +---------+---------------+---------+-----------+----------+-------+    Summary: Right: Findings consistent with acute deep vein thrombosis involving the right popliteal vein. Findings consistent with age indeterminate deep vein thrombosis involving the right femoral vein. No cystic structure found in the popliteal  fossa. Left: There is no evidence of deep vein thrombosis in the lower extremity. No cystic structure found in the popliteal fossa.  *See table(s) above for measurements and observations. Electronically signed by Fabienne Bruns MD on 11/07/2018 at 5:01:23 PM.    Final         Scheduled Meds: . amoxicillin  500 mg Oral Q8H  . finasteride  5 mg Oral Daily  . insulin aspart  0-15 Units Subcutaneous TID WC  . insulin aspart  0-5 Units Subcutaneous QHS  . insulin glargine  10 Units Subcutaneous Daily  . multivitamin with minerals  1 tablet Oral Daily  . pentosan polysulfate  100 mg Oral TID  . tamsulosin  0.4 mg Oral QPC supper   Continuous Infusions: . sodium chloride 100 mL/hr at 11/08/18 0600  . sodium chloride irrigation       LOS: 5 days   Time spent= 40 mins    Ankit  Joline Maxcy, MD [facility]  If 7PM-7AM, please contact night-coverage [email or web address] 11/08/2018, 12:18 PM

## 2018-11-08 NOTE — Progress Notes (Signed)
Subjective: Patient reports No complaints.  His wife fell in the cafeteria and was taken to the emergency room.  One of the cafeteria workers called the patient.  Objective: Vital signs in last 24 hours: Temp:  [98.2 F (36.8 C)-98.3 F (36.8 C)] 98.2 F (36.8 C) (03/10 0451) Pulse Rate:  [89-99] 99 (03/10 0451) BP: (131-154)/(78-89) 131/78 (03/10 0451) SpO2:  [89 %-98 %] 94 % (03/10 0451)  Intake/Output from previous day: 03/09 0701 - 03/10 0700 In: 24737.9 [P.O.:480; I.V.:2407.9] Out: [id number] [Urine:22150] Intake/Output this shift: No intake/output data recorded.  Physical Exam:  NAD Urine light pink on a slow CBI and clear when CBI started.   Lab Results: Recent Labs    11/06/18 0527 11/07/18 0458 11/08/18 0538  HGB 11.5* 10.2* 9.7*  HCT 37.3* 33.4* 31.7*   BMET Recent Labs    11/07/18 0458 11/08/18 0538  NA 137 137  K 3.7 3.6  CL 109 106  CO2 22 24  GLUCOSE 213* 262*  BUN 15 16  CREATININE 1.07 1.14  CALCIUM 7.7* 8.0*   No results for input(s): LABPT, INR in the last 72 hours. No results for input(s): LABURIN in the last 72 hours. Results for orders placed or performed during the hospital encounter of 11/03/18  Urine Culture     Status: Abnormal   Collection Time: 11/03/18  2:48 AM  Result Value Ref Range Status   Specimen Description   Final    URINE, CLEAN CATCH Performed at [facility], [address] [place], [place] [address]    Special Requests   Final    NONE Performed at [facility], [address] [place], [place] [address]    Culture >=100,000 COLONIES/mL STAPHYLOCOCCUS AUREUS (A)  Final   Report Status 11/05/2018 FINAL  Final   Organism ID, Bacteria STAPHYLOCOCCUS AUREUS (A)  Final      Susceptibility   Staphylococcus aureus - MIC*    CIPROFLOXACIN 2 INTERMEDIATE Intermediate     GENTAMICIN <=0.5 SENSITIVE Sensitive     NITROFURANTOIN <=16 SENSITIVE Sensitive     OXACILLIN <=0.25 SENSITIVE  Sensitive     TETRACYCLINE <=1 SENSITIVE Sensitive     VANCOMYCIN <=0.5 SENSITIVE Sensitive     TRIMETH/SULFA <=10 SENSITIVE Sensitive     CLINDAMYCIN <=0.25 SENSITIVE Sensitive     RIFAMPIN <=0.5 SENSITIVE Sensitive     Inducible Clindamycin NEGATIVE Sensitive     * >=100,000 COLONIES/mL STAPHYLOCOCCUS AUREUS  Culture, blood (Routine X 2) w Reflex to ID Panel     Status: None   Collection Time: 11/03/18  6:29 AM  Result Value Ref Range Status   Specimen Description   Final    BLOOD RIGHT ANTECUBITAL Performed at [facility], [address] [place], [place] [address]    Special Requests   Final    BOTTLES DRAWN AEROBIC AND ANAEROBIC Blood Culture adequate volume Performed at [facility], [address] [place], [place] [address]    Culture   Final    NO GROWTH 5 DAYS Performed at [facility], [address] [place], [place] [address]    Report Status 11/08/2018 FINAL  Final  Culture, blood (Routine X 2) w Reflex to ID Panel     Status: None   Collection Time: 11/03/18  6:29 AM  Result Value Ref Range Status   Specimen Description   Final    BLOOD LEFT ANTECUBITAL Performed at [facility], [address] [place], [place] [address]    Special Requests   Final    BOTTLES DRAWN AEROBIC AND ANAEROBIC Blood Culture adequate volume Performed at [facility], [address], [place], [place] [address]    Culture   Final    NO GROWTH 5 DAYS Performed at [facility], [address] [place], [place] [address]    Report Status 11/08/2018 FINAL  Final    Studies/Results: Vas [place] Lower Extremity Venous (dvt)  Result Date: 11/07/2018  Lower Venous Study Indications: History of DVT.  Performing Technologist: Chanda Busing RVT  Examination Guidelines: A complete evaluation includes B-mode imaging, spectral Doppler, color Doppler, and power Doppler as needed of all accessible portions of each  vessel. Bilateral testing is considered an integral part of a complete examination. Limited examinations for reoccurring indications may be performed as noted.  Right Venous Findings: +---------+---------------+---------+-----------+----------+-----------------+          CompressibilityPhasicitySpontaneityPropertiesSummary           +---------+---------------+---------+-----------+----------+-----------------+ CFV      Full           Yes      Yes                                    +---------+---------------+---------+-----------+----------+-----------------+ SFJ      Full                                                           +---------+---------------+---------+-----------+----------+-----------------+ FV Prox  Partial                                      Age Indeterminate +---------+---------------+---------+-----------+----------+-----------------+ FV Mid   Partial                                      Age Indeterminate +---------+---------------+---------+-----------+----------+-----------------+ FV DistalPartial        Yes      Yes                  Age Indeterminate +---------+---------------+---------+-----------+----------+-----------------+ PFV      Full                                                           +---------+---------------+---------+-----------+----------+-----------------+ POP      Partial        Yes      Yes                  Acute             +---------+---------------+---------+-----------+----------+-----------------+ PTV      Full                                                           +---------+---------------+---------+-----------+----------+-----------------+  PERO     Full                                                           +---------+---------------+---------+-----------+----------+-----------------+  Left Venous Findings: +---------+---------------+---------+-----------+----------+-------+           CompressibilityPhasicitySpontaneityPropertiesSummary +---------+---------------+---------+-----------+----------+-------+ CFV      Full           Yes      Yes                          +---------+---------------+---------+-----------+----------+-------+ SFJ      Full                                                 +---------+---------------+---------+-----------+----------+-------+ FV Prox  Full                                                 +---------+---------------+---------+-----------+----------+-------+ FV Mid   Full                                                 +---------+---------------+---------+-----------+----------+-------+ FV DistalFull                                                 +---------+---------------+---------+-----------+----------+-------+ PFV      Full                                                 +---------+---------------+---------+-----------+----------+-------+ POP      Full           Yes      Yes                          +---------+---------------+---------+-----------+----------+-------+ PTV      Full                                                 +---------+---------------+---------+-----------+----------+-------+ PERO     Full                                                 +---------+---------------+---------+-----------+----------+-------+    Summary: Right: Findings consistent with acute deep vein thrombosis involving the right popliteal vein. Findings consistent with age indeterminate deep vein thrombosis involving the right femoral vein. No cystic structure found in the popliteal  fossa. Left: There is no evidence of deep vein thrombosis in the lower extremity. No cystic structure found in the popliteal fossa.  *See table(s) above for measurements and observations. Electronically signed by Fabienne Bruns MD on 11/07/2018 at 5:01:23 PM.    Final     Assessment/Plan: #1 gross hematuria-This is likely from  BPH and bladder stone. Discussed with the patient again the the nature of risks benefits and alternatives to cystoscopy, cystolitholapaxy with the laser, possible TURP, possible TURBT. And the patient does have venous thrombosis.  He will need to restart anticoagulation and is certainly at high risk to bleed again.  I'll take the patient to the OR today.  Hopefully we can clear the bladder stone and some of the prostate.  Again discussed with patient and he may need a staged procedure and further work on the prostate down the road.  The goal would be to mitigate the risk of bleeding once anticoagulation starts but also hopefully improve his voiding.  #2 bilateral hydronephrosis-this is likely from urinary retention and reflux. UOP good and Cr returned to normal. Check renal US today.   #3 BPH with weak stream and likely overflow incontinence-tamsulosin and finasteride started.   #4bladder stone-will need laser cystolitholopaxy versus bladder stone removal and robotic simple.  #5UTI-Culture grew greater than 100,000 staph aureus. He is on amoxicillin.   LOS: 5 days   [name] 11/08/2018, 7:20 AM

## 2018-11-08 NOTE — Progress Notes (Signed)
Inpatient Diabetes Program Recommendations  AACE/ADA: New Consensus Statement on Inpatient Glycemic Control (2015)  Target Ranges:  Prepandial:   less than 140 mg/dL      Peak postprandial:   less than 180 mg/dL (1-2 hours)      Critically ill patients:  140 - 180 mg/dL   Lab Results  Component Value Date   GLUCAP 216 (H) 11/08/2018    Review of Glycemic Control  Blood sugars over goal of < 180 mg/dL. Needs insulin adjustment.  Inpatient Diabetes Program Recommendations:     Increase Lantus to 15 units QD Add Novolog 3 units tidwc for meal coverage insulin.  Will continue to follow.  Thank you. Ailene Ards, RD, LDN, CDE Inpatient Diabetes Coordinator [phone]

## 2018-11-08 NOTE — Anesthesia Procedure Notes (Signed)
Procedure Name: LMA Insertion Date/Time: 11/08/2018 4:01 PM Performed by: Thornell Mule, CRNA Pre-anesthesia Checklist: Patient identified, Emergency Drugs available, Suction available and Patient being monitored Patient Re-evaluated:Patient Re-evaluated prior to induction Oxygen Delivery Method: Circle system utilized Preoxygenation: Pre-oxygenation with 100% oxygen Induction Type: IV induction LMA: LMA inserted LMA Size: 4.0 Number of attempts: 1 Placement Confirmation: positive ETCO2 Tube secured with: Tape Dental Injury: Teeth and Oropharynx as per pre-operative assessment

## 2018-11-08 NOTE — Op Note (Signed)
Preoperative diagnosis: Gross hematuria, bladder stone, bilateral hydronephrosis, BPH Postoperative diagnosis: Same  Procedure: Cystoscopy, cystolitholopaxy with holmium laser 3.3 cm stone, transurethral resection of prostate, bilateral retrograde pyelogram, bilateral ureteral stent placement  Surgeon: Mena Goes  Anesthesia: General  Indication for procedure: Timothy Walsh presented with gross hematuria and clot retention.  CT scan of the abdomen and pelvis obtained showed bilateral hydroureteronephrosis down to a distended bladder with a large bladder stone and a large prostate.  He has had a catheter for almost a week and continued to have on and off gross hematuria.  He needs to go back on anticoagulation because of a DVT and he is on antibiotics for UTI so we went ahead and brought him today for the above procedures.  Also, his creatinine was normalizing but went up a slight amount today.  Ultrasound showed severe bilateral hydroureteronephrosis persisted despite bladder drainage.  Findings: On exam under anesthesia the penis was circumcised and without mass or lesion.  Testicles descended bilaterally and palpably normal.  On digital rectal exam the prostate was smooth without hard area or nodule in about 80 g.  On cystoscopy the prostate was obstructed by lateral lobe hypertrophy but mainly a high bladder neck.  There was diffuse erythema of the bladder wall from a large yellow stone.  There was bullous edema around both ureteral orifice ease but they were identified.  There were no mucosal lesions in the bladder just mainly erythema and edema throughout.  Left retrograde pyelogram-this outlined the distal ureter which was severely distended 1 to 2 cm and there appeared to be obstruction at the bladder wall with a very narrow intramural tunnel.  E flux of contrast was sluggish.  Right retrograde pyelogram-this outlined the distal ureter which was severely distended 1 to 2 cm.  Again there was  obstruction at the bladder wall with a very narrow intramural tunnel.  Efflux again was sluggish.  Description of procedure: After consent was obtained patient brought to the operating room.  After adequate anesthesia was placed in lithotomy position.  I performed an exam under anesthesia.  He was prepped and draped in usual sterile fashion.  Timeout was performed to confirm the patient and procedure.  I then passed the cystoscope per urethra and the bladder inspected although visualization was difficult.  A lot of edema and a large bladder stone.  Therefore I passed the laser continuous flow sheath and a 1000 m laser fiber and the stone dusted nicely with about 80% to dust.  Setting at 1 and 30 for most of that and then dropped it down to 1 and 10 to finish off the small pieces.  These were collected.  Next I turned my attention to the prostate and the bladder neck where the prostate protruded into the bladder was oozing.  I switched from the continuous flow laser sheath to the continuous flow sheath with a large loop. I made an incision at 7:00 after identifying what I thought was the right ureteral orifice.  I brought this down to the verumontanum.  There appeared to be more stroma than glans.  I made an incision in line with the left ureteral orifice at 5:00 and brought this down to the verumontanum.  And then resected some of the median lobe tissue and bladder neck.  This began to open up the channel nicely and I resected the right and left lateral lobes starting at the bladder neck and brought that down to the verumontanum.  Resection was not complete but  created a decent channel and stopped the mucosal bleeding.  The chips were all evacuated.  I switched back to the cystoscope.  I did not see E flux from the left ureteral orifice.  Sluggish from the right.  Therefore the left ureteral orifice was cannulated with a 6 [place] open-ended catheter and retrograde injection of contrast was performed.  With  some difficulty I was able to get a sensor wire through the tortuous ureter and get it to straighten out the left ureter and coiled in the collecting system.  A 626 cm stent was advanced.  The wire was removed with a good coil seen in the kidney and a good coil in the bladder.  Similarly the right ureteral orifice was cannulated with a 6 [place] open-ended catheter which guided the sensor wire through the tortuous ureter and I had to advance the 6 French catheter to get it to straighten out the ureter and sequentially advanced the wire which finally coiled proximally.  A 626 cm stent was advanced.  A good coil was seen in the collecting system on the right and a good coil in the bladder.  Hemostasis was excellent and the scope removed.  I passed a 47 [place] hematuria catheter for good measure and filled it with 30 cc and seated at the bladder neck.  Irrigation was clear but for wake up I connected it to CBI.  He was awakened and taken to the recovery room in stable condition.  We made the decision at the beginning not to use SCDs given the fact that he has lower extremity clots.  He remained very stable throughout the procedure.  Complications: None  Blood loss: 100 mL  Specimens: #1 TURP chips to pathology #2 bladder stone debris to patient  Drains: Bilateral 6 x 26 cm ureteral stents, 22 French three-way hematuria catheter  Disposition: Patient stable to PACU

## 2018-11-08 NOTE — Progress Notes (Signed)
Spoke with NP on call, Schorr, regarding scheduled PO abx due at 0600 this morning. Pt has been NPO since midnight for possible surgery today. Advised to hold abx until urology sees pt in the morning and determines need for surgery. Abx rescheduled for 0800 pending urology decision. Will continue to monitor.

## 2018-11-08 NOTE — Discharge Instructions (Signed)
Ureteral Stent Implantation, Care After Refer to this sheet in the next few weeks. These instructions provide you with information about caring for yourself after your procedure. Your health care provider may also give you more specific instructions. Your treatment has been planned according to current medical practices, but problems sometimes occur. Call your health care provider if you have any problems or questions after your procedure.  Be sure to keep follow-up to have the stents removed.   What can I expect after the procedure? After the procedure, it is common to have:  Nausea.  Mild pain when you urinate. You may feel this pain in your lower back or lower abdomen. Pain should stop within a few minutes after you urinate. This may last for up to 1 week.  A small amount of blood in your urine for several days. Follow these instructions at home:  Medicines  Take over-the-counter and prescription medicines only as told by your health care provider.  If you were prescribed an antibiotic medicine, take it as told by your health care provider. Do not stop taking the antibiotic even if you start to feel better.  Do not drive for 24 hours if you received a sedative.  Do not drive or operate heavy machinery while taking prescription pain medicines. Activity  Return to your normal activities as told by your health care provider. Ask your health care provider what activities are safe for you.  Do not lift anything that is heavier than 10 lb (4.5 kg). Follow this limit for 1 week after your procedure, or for as long as told by your health care provider. General instructions  Watch for any blood in your urine. Call your health care provider if the amount of blood in your urine increases.  If you have a catheter: ? Follow instructions from your health care provider about taking care of your catheter and collection bag. ? Do not take baths, swim, or use a hot tub until your health care  provider approves.  Drink enough fluid to keep your urine clear or pale yellow.  Keep all follow-up visits as told by your health care provider. This is important. Contact a health care provider if:  You have pain that gets worse or does not get better with medicine, especially pain when you urinate.  You have difficulty urinating.  You feel nauseous or you vomit repeatedly during a period of more than 2 days after the procedure. Get help right away if:  Your urine is dark red or has blood clots in it.  You are leaking urine (have incontinence).  The end of the stent comes out of your urethra.  You cannot urinate.  You have sudden, sharp, or severe pain in your abdomen or lower back.  You have a fever. This information is not intended to replace advice given to you by your health care provider. Make sure you discuss any questions you have with your health care provider. Document Released: 04/19/2013 Document Revised: 01/23/2016 Document Reviewed: 03/01/2015 Elsevier Interactive Patient Education  2019 [employer]   Transurethral Resection of the Prostate, Care After Refer to this sheet in the next few weeks. These instructions provide you with information about caring for yourself after your procedure. Your health care provider may also give you more specific instructions. Your treatment has been planned according to current medical practices, but problems sometimes occur. Call your health care provider if you have any problems or questions after your procedure. What can I expect after  the procedure? After the procedure, it is common to have:  Mild pain in your lower abdomen.  Soreness or mild discomfort in your penis from having the catheter inserted during the procedure.  A feeling of urgency when you need to urinate.  A small amount of blood in your urine. You may notice some small blood clots in your urine. These are normal. Follow these instructions at  home: Medicines   Take over-the-counter and prescription medicines only as told by your health care provider.  Do not drive or operate heavy machinery while taking prescription pain medicine.  Do not drive for 24 hours if you received a sedative.  If you were prescribed antibiotic medicine, take it as told by your health care provider. Do not stop taking the antibiotic even if you start to feel better. Activity  Return to your normal activities as told by your health care provider. Ask your health care provider what activities are safe for you.  Do not lift anything that is heavier than 10 lb (4.5 kg) for 3 weeks after your procedure, or as long as told by your health care provider.  Avoid intense physical activity for as long as told by your health care provider.  Avoid sitting for a long time without moving. Get up and move around one or more times every few hours. This helps to prevent blood clots. You may increase your physical activity gradually as you start to feel better. Lifestyle  Do not drink alcohol for as long as told by your health care provider. This is especially important if you are taking prescription pain medicines.  Do not engage in sexual activity until your health care provider says that you can do this. General instructions  Do not take baths, swim, or use a hot tub until your health care provider approves.  Drink enough fluid to keep your urine clear or pale yellow.  Urinate as soon as you feel the need to. Do not try to hold your urine for long periods of time.  If your health care provider approves, you may take a stool softener for 2-3 weeks to prevent you from straining to have a bowel movement.  Wear compression stockings as told by your health care provider. These stockings help to prevent blood clots and reduce swelling in your legs.  Keep all follow-up visits as told by your health care provider. This is important. Contact a health care provider  if:  You have difficulty urinating.  You have a fever.  You have pain that gets worse or does not improve with medicine.  You have blood in your urine that does not go away after 1 week of resting and drinking more fluids.  You have swelling in your penis or testicles. Get help right away if:  You are unable to urinate.  You are having more blood clots in your urine instead of fewer.  You have: ? Large blood clots. ? A lot of blood in your urine. ? Pain in your back or lower abdomen. ? Pain or swelling in your legs. ? Chills and you are shaking. This information is not intended to replace advice given to you by your health care provider. Make sure you discuss any questions you have with your health care provider. Document Released: 08/17/2005 Document Revised: 09/30/2016 Document Reviewed: 05/09/2015 Elsevier Interactive Patient Education  2019 [employer].

## 2018-11-08 NOTE — Anesthesia Preprocedure Evaluation (Addendum)
Anesthesia Evaluation  Patient identified by MRN, date of birth, ID band Patient awake    Reviewed: Allergy & Precautions, NPO status , Patient's Chart, lab work & pertinent test results  Airway Mallampati: III  TM Distance: >3 FB Neck ROM: Full    Dental no notable dental hx. (+)    Pulmonary    Pulmonary exam normal breath sounds clear to auscultation       Cardiovascular hypertension, + Past MI  Normal cardiovascular exam Rhythm:Regular Rate:Normal     Neuro/Psych    GI/Hepatic   Endo/Other  diabetes, Type 2, Oral Hypoglycemic Agents  Renal/GU      Musculoskeletal   Abdominal   Peds  Hematology  (+) Blood dyscrasia, anemia ,   Anesthesia Other Findings Cysto for bladder stone  On xarelto, last dose 4 days ago  Reproductive/Obstetrics                           Anesthesia Physical Anesthesia Plan  ASA: III  Anesthesia Plan: General   Post-op Pain Management:    Induction: Intravenous  PONV Risk Score and Plan: 2 and Ondansetron, Dexamethasone and Midazolam  Airway Management Planned: LMA  Additional Equipment:   Intra-op Plan:   Post-operative Plan: Extubation in OR  Informed Consent: I have reviewed the patients History and Physical, chart, labs and discussed the procedure including the risks, benefits and alternatives for the proposed anesthesia with the patient or authorized representative who has indicated his/her understanding and acceptance.     Dental advisory given  Plan Discussed with: CRNA  Anesthesia Plan Comments:         Anesthesia Quick Evaluation

## 2018-11-08 NOTE — Transfer of Care (Signed)
Immediate Anesthesia Transfer of Care Note  Patient: Timothy Walsh  Procedure(s) Performed: Procedure(s): TRANSURETHRAL RESECTION OF PROSTATE, BILATERAL URETERAL STENTS, CYSTOSCOPY WITH HOLMIUM LASER  LITHOLAPAXY 3.3CM, BILATERAL RETROGRADE PYELOGRAM (N/A)  Patient Location: PACU  Anesthesia Type:General  Level of Consciousness:  sedated, patient cooperative and responds to stimulation  Airway & Oxygen Therapy:Patient Spontanous Breathing and Patient connected to face mask oxgen  Post-op Assessment:  Report given to PACU RN and Post -op Vital signs reviewed and stable  Post vital signs:  Reviewed and stable  Last Vitals:  Vitals:   11/08/18 0451 11/08/18 1447  BP: 131/78 (!) 142/81  Pulse: 99 89  Resp:  20  Temp: 36.8 C 36.4 C  SpO2: 94% 93%    Complications: No apparent anesthesia complications

## 2018-11-09 ENCOUNTER — Encounter (HOSPITAL_COMMUNITY): Payer: Self-pay | Admitting: Urology

## 2018-11-09 LAB — CBC
HCT: 31 % — ABNORMAL LOW (ref 39.0–52.0)
Hemoglobin: 9.9 g/dL — ABNORMAL LOW (ref 13.0–17.0)
MCH: 29 pg (ref 26.0–34.0)
MCHC: 31.9 g/dL (ref 30.0–36.0)
MCV: 90.9 fL (ref 80.0–100.0)
Platelets: 267 10*3/uL (ref 150–400)
RBC: 3.41 MIL/uL — AB (ref 4.22–5.81)
RDW: 13.5 % (ref 11.5–15.5)
WBC: 9.6 10*3/uL (ref 4.0–10.5)
nRBC: 0 % (ref 0.0–0.2)

## 2018-11-09 LAB — BASIC METABOLIC PANEL
Anion gap: 10 (ref 5–15)
BUN: 14 mg/dL (ref 8–23)
CO2: 23 mmol/L (ref 22–32)
Calcium: 7.7 mg/dL — ABNORMAL LOW (ref 8.9–10.3)
Chloride: 104 mmol/L (ref 98–111)
Creatinine, Ser: 1.08 mg/dL (ref 0.61–1.24)
GFR calc Af Amer: >60 mL/min (ref >60)
GFR calc non Af Amer: >60 mL/min (ref >60)
Glucose, Bld: 273 mg/dL — ABNORMAL HIGH (ref 70–99)
Potassium: 3.6 mmol/L (ref 3.5–5.1)
Sodium: 137 mmol/L (ref 135–145)

## 2018-11-09 LAB — GLUCOSE, CAPILLARY
Glucose-Capillary: 245 mg/dL — ABNORMAL HIGH (ref 70–99)
Glucose-Capillary: 217 mg/dL — ABNORMAL HIGH (ref 70–99)
Glucose-Capillary: 210 mg/dL — ABNORMAL HIGH (ref 70–99)
Glucose-Capillary: 262 mg/dL — ABNORMAL HIGH (ref 70–99)

## 2018-11-09 LAB — MAGNESIUM: Magnesium: 1.6 mg/dL — ABNORMAL LOW (ref 1.7–2.4)

## 2018-11-09 MED ORDER — CEPHALEXIN 500 MG PO CAPS
500.0000 mg | ORAL_CAPSULE | Freq: Three times a day (TID) | ORAL | Status: DC
  Administered 2018-11-09 – 2018-11-11 (×7): 500 mg via ORAL
  Filled 2018-11-09 (×7): qty 1

## 2018-11-09 MED ORDER — CEPHALEXIN 500 MG PO CAPS: 500.0000 mg | ORAL_CAPSULE | Freq: Three times a day (TID) | ORAL | 0 refills | Status: DC

## 2018-11-09 NOTE — Anesthesia Postprocedure Evaluation (Signed)
Anesthesia Post Note  Patient: Timothy Walsh  Procedure(s) Performed: TRANSURETHRAL RESECTION OF PROSTATE, BILATERAL URETERAL STENTS, CYSTOSCOPY WITH HOLMIUM LASER  LITHOLAPAXY 3.3CM, BILATERAL RETROGRADE PYELOGRAM (N/A )     Patient location during evaluation: PACU Anesthesia Type: General Level of consciousness: awake and alert Pain management: pain level controlled Vital Signs Assessment: post-procedure vital signs reviewed and stable Respiratory status: spontaneous breathing, nonlabored ventilation, respiratory function stable and patient connected to nasal cannula oxygen Cardiovascular status: blood pressure returned to baseline and stable Postop Assessment: no apparent nausea or vomiting Anesthetic complications: no    Last Vitals:  Vitals:   11/09/18 0155 11/09/18 0616  BP: 135/81 135/80  Pulse: 90 93  Resp: 18 18  Temp: 36.7 C 36.5 C  SpO2: 95% 96%    Last Pain:  Vitals:   11/09/18 0616  TempSrc: Oral  PainSc:                  [name]

## 2018-11-09 NOTE — Progress Notes (Signed)
PROGRESS NOTE    Timothy Walsh  YOF:[id number] DOB: 07/14/1951 DOA: 11/03/2018 PCP: Patient, No Pcp Per   Brief Narrative:  68 year old with history of CAD, history of MI, diabetes mellitus type 2, essential hypertension came to the hospital with complains of urinary burning and hematuria.  On CT of the abdomen was noted to have bilateral hydroureteronephrosis with renal stone and enlarged prostate.  Urology was consulted.  IV Rocephin started.Recommended to start Flomax and finasteride with possible need for outpatient stone removal procedure.  Urine culture ended growing staph aureus susceptible to multiple antibiotics.  Initially started on vancomycin later switched to amoxicillin.  He continued to have bleeding therefore Xarelto was stopped and restarted on CBI's.   Subjective: Continues to have some hematuria overnight.  On CBI.  This morning unable to drain the catheter therefore urology adjusted.  Assessment & Plan:   Active Problems:   Complicated UTI (urinary tract infection)   Sepsis due to urinary tract infection (HCC)   Calculus of urinary bladder   Bilateral hydronephrosis   DKA (diabetic ketoacidoses) (HCC)   DM II (diabetes mellitus, type II), controlled (HCC)   Hematuria   Chronic anticoagulation   CAD (coronary artery disease)   Essential hypertension  Bilateral hydronephrosis secondary to bladder stone and gross hematuria, persistent Benign prostatic hyperplasia -Ultrasound showed continued severe hydronephrosis and with retrograde showed obstruction with thickened bladder wall, stent placement November 08, 2018, stent to be removed in 3 weeks per urology. -Management per urology, their input greatly appreciated, status post TURP, November 08, 2018, and CBI overnight, gross hematuria has resolved.  MSSA urinary tract infection - Currently on amoxicillin, will transition to Keflex today.  Gross hematuria - That is post CBI overnight, resolved this morning . -Resume  anticoagulation by tomorrow as okay per urology  BPH -Foley in place, Flomax and finasteride continued.  Status post TURP November 08, 2018, void trial tomorrow morning and home if stable  Acute blood loss anemia, normocytic - Baseline hemoglobin around 12.0.  This has trended down due to persistent hematuria.  Closely monitor this.  History of coronary artery disease -Currently patient is chest pain-free.  Stop ASA for now as well.   Diabetes mellitus type 2, uncontrolled -Insulin sliding scale and Accu-Chek.  Metformin on hold.  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  70 Doppler still showing evidence of acute DVT, will need full anticoagulation, to resume tomorrow  DVT prophylaxis: SCDs Code Status: Full code Family Communication: Wife at bedside Disposition Plan: Maintain hospital stay until his hematuria is improved.  Consultants:   Urology  Procedures:   None  Antimicrobials:   Rocephin day 2, stopped  Vancomycin day 1, stopped  Amoxicillin day 3     Objective: Vitals:   11/08/18 1950 11/08/18 2020 11/09/18 0155 11/09/18 0616  BP: (!) 143/82  135/81 135/80  Pulse: 88  90 93  Resp: (!) 22  18 18   Temp:  98 F (36.7 C) 98 F (36.7 C) 97.7 F (36.5 C)  TempSrc:  Oral Oral Oral  SpO2: 97%  95% 96%  Weight:      Height:        Intake/Output Summary (Last 24 hours) at 11/09/2018 1306 Last data filed at 11/09/2018 1205 Gross per 24 hour  Intake 8188.72 ml  Output 66815 ml  Net -3746.28 ml   Filed Weights   11/04/18 0427  Weight: 92.9 kg    Examination:  Awake Alert, Oriented X 3,  No new F.N deficits, Normal affect Symmetrical Chest wall movement, Good air movement bilaterally, CTAB RRR,No Gallops,Rubs or new Murmurs, No Parasternal Heave +ve B.Sounds, Abd Soft, No tenderness, No rebound - guarding or rigidity. No Cyanosis, Clubbing or edema, No new Rash or bruise    Foley catheter has clear-colored  urine today  Data Reviewed:   CBC: Recent Labs  Lab 11/03/18 0248  11/05/18 0825 11/06/18 0527 11/07/18 0458 11/08/18 0538 11/09/18 0544  WBC 14.5*   < > 10.3 7.8 7.6 9.4 9.6  NEUTROABS 12.1*  --   --   --   --   --   --   HGB 15.2   < > 11.3* 11.5* 10.2* 9.7* 9.9*  HCT 47.1   < > 37.0* 37.3* 33.4* 31.7* 31.0*  MCV 89.2   < > 93.2 92.3 92.5 93.0 90.9  PLT 217   < > 165 200 207 250 267   < > = values in this interval not displayed.   Basic Metabolic Panel: Recent Labs  Lab 11/05/18 0825 11/06/18 0527 11/07/18 0458 11/08/18 0538 11/09/18 0544  NA 135 134* 137 137 137  K 3.2* 4.0 3.7 3.6 3.6  CL 109 105 109 106 104  CO2 GLUCOSE 218* 288* 213* 262* 273*  BUN CREATININE 1.09 1.29* 1.07 1.14 1.08  CALCIUM 7.6* 7.8* 7.7* 8.0* 7.7*  MG 1.8 1.9 1.7 1.7 1.6*   GFR: Estimated Creatinine Clearance: 75.8 mL/min (by C-G formula based on SCr of 1.08 mg/dL). Liver Function Tests: No results for input(s): AST, ALT, ALKPHOS, BILITOT, PROT, ALBUMIN in the last 168 hours. No results for input(s): LIPASE, AMYLASE in the last 168 hours. No results for input(s): AMMONIA in the last 168 hours. Coagulation Profile: Recent Labs  Lab 11/03/18 0248 11/04/18 0536  INR 1.0 1.3*   Cardiac Enzymes: Recent Labs  Lab 11/03/18 0249  TROPONINI <0.03   BNP (last 3 results) No results for input(s): PROBNP in the last 8760 hours. HbA1C: No results for input(s): HGBA1C in the last 72 hours. CBG: Recent Labs  Lab 11/08/18 1458 11/08/18 1855 11/08/18 2218 11/09/18 0747 11/09/18 1145  GLUCAP 119* 119* 193* 245* 217*   Lipid Profile: No results for input(s): CHOL, HDL, LDLCALC, TRIG, CHOLHDL, LDLDIRECT in the last 72 hours. Thyroid Function Tests: No results for input(s): TSH, T4TOTAL, FREET4, T3FREE, THYROIDAB in the last 72 hours. Anemia Panel: No results for input(s): VITAMINB12, FOLATE, FERRITIN, TIBC, IRON, RETICCTPCT in the last 72 hours. Sepsis  Labs: Recent Labs  Lab 11/03/18 1610  LATICACIDVEN 2.4*    Recent Results (from the past 240 hour(s))  Urine Culture     Status: Abnormal   Collection Time: 11/03/18  2:48 AM  Result Value Ref Range Status   Specimen Description   Final    URINE, CLEAN CATCH Performed at [facility], [address] [place], [place] [address]    Special Requests   Final    NONE Performed at [facility], [address] [place], [place] [address]    Culture >=100,000 COLONIES/mL STAPHYLOCOCCUS AUREUS (A)  Final   Report Status 11/05/2018 FINAL  Final   Organism ID, Bacteria STAPHYLOCOCCUS AUREUS (A)  Final      Susceptibility   Staphylococcus aureus - MIC*    CIPROFLOXACIN 2 INTERMEDIATE Intermediate     GENTAMICIN <=0.5 SENSITIVE Sensitive     NITROFURANTOIN <=16 SENSITIVE Sensitive  OXACILLIN <=0.25 SENSITIVE Sensitive     TETRACYCLINE <=1 SENSITIVE Sensitive     VANCOMYCIN <=0.5 SENSITIVE Sensitive     TRIMETH/SULFA <=10 SENSITIVE Sensitive     CLINDAMYCIN <=0.25 SENSITIVE Sensitive     RIFAMPIN <=0.5 SENSITIVE Sensitive     Inducible Clindamycin NEGATIVE Sensitive     * >=100,000 COLONIES/mL STAPHYLOCOCCUS AUREUS  Culture, blood (Routine X 2) w Reflex to ID Panel     Status: None   Collection Time: 11/03/18  6:29 AM  Result Value Ref Range Status   Specimen Description   Final    BLOOD RIGHT ANTECUBITAL Performed at [facility], [address], [place], [place] [address]    Special Requests   Final    BOTTLES DRAWN AEROBIC AND ANAEROBIC Blood Culture adequate volume Performed at [facility], [address] [place], [place] [address]    Culture   Final    NO GROWTH 5 DAYS Performed at [facility], [address] [place], [place] [address]    Report Status 11/08/2018 FINAL  Final  Culture, blood (Routine X 2) w Reflex to ID Panel     Status: None   Collection Time: 11/03/18  6:29 AM    Result Value Ref Range Status   Specimen Description   Final    BLOOD LEFT ANTECUBITAL Performed at [facility], [address], [place], [place] [address]    Special Requests   Final    BOTTLES DRAWN AEROBIC AND ANAEROBIC Blood Culture adequate volume Performed at [facility], [address], [place], [place] [address]    Culture   Final    NO GROWTH 5 DAYS Performed at [facility], [address], [place], [place] [address]    Report Status 11/08/2018 FINAL  Final  Surgical pcr screen     Status: Abnormal   Collection Time: 11/08/18 12:50 PM  Result Value Ref Range Status   MRSA, PCR NEGATIVE NEGATIVE Final   Staphylococcus aureus POSITIVE (A) NEGATIVE Final    Comment: (NOTE) The Xpert SA Assay (FDA approved for NASAL specimens in patients 68 years of age and older), is one component of a comprehensive surveillance program. It is not intended to diagnose infection nor to guide or monitor treatment. Performed at [facility], [address] [place], [place] [address]          Radiology Studies: US Renal  Result Date: 11/08/2018 CLINICAL DATA:  Hydronephrosis. EXAM: RENAL / URINARY TRACT ULTRASOUND COMPLETE COMPARISON:  CT scan of November 03, 2018. FINDINGS: Right Kidney: Renal measurements: 14.2 x 7.4 x 7.0 cm = volume: 384 mL . Echogenicity within normal limits. Severe hydronephrosis is noted. No mass visualized. Left Kidney: Renal measurements: 13.6 x 6.9 x 5.8 cm = volume: 284 mL. Echogenicity within normal limits. Severe hydronephrosis is noted. No mass visualized. Bladder: Diffuse bladder wall thickening is noted. Large bladder calculus is noted. Foley catheter is noted. IMPRESSION: Severe bilateral hydronephrosis is noted. Large bladder calculus is noted with Foley catheter. Diffuse bladder wall thickening is noted and cystitis can not be excluded. Electronically Signed   By: Lupita Raider, M.D.   On:  11/08/2018 12:10   Vas [place] Lower Extremity Venous (dvt)  Result Date: 11/07/2018  Lower Venous Study Indications: History of DVT.  Performing Technologist: Chanda Busing RVT  Examination Guidelines: A complete evaluation includes B-mode imaging, spectral Doppler, color Doppler, and power Doppler as needed of  all accessible portions of each vessel. Bilateral testing is considered an integral part of a complete examination. Limited examinations for reoccurring indications may be performed as noted.  Right Venous Findings: +---------+---------------+---------+-----------+----------+-----------------+            Compressibility Phasicity Spontaneity Properties Summary            +---------+---------------+---------+-----------+----------+-----------------+  CFV       Full            Yes       Yes                                       +---------+---------------+---------+-----------+----------+-----------------+  SFJ       Full                                                                +---------+---------------+---------+-----------+----------+-----------------+  FV Prox   Partial                                          Age Indeterminate  +---------+---------------+---------+-----------+----------+-----------------+  FV Mid    Partial                                          Age Indeterminate  +---------+---------------+---------+-----------+----------+-----------------+  FV Distal Partial         Yes       Yes                    Age Indeterminate  +---------+---------------+---------+-----------+----------+-----------------+  PFV       Full                                                                +---------+---------------+---------+-----------+----------+-----------------+  POP       Partial         Yes       Yes                    Acute              +---------+---------------+---------+-----------+----------+-----------------+  PTV       Full                                                                 +---------+---------------+---------+-----------+----------+-----------------+  PERO      Full                                                                +---------+---------------+---------+-----------+----------+-----------------+  Left Venous Findings: +---------+---------------+---------+-----------+----------+-------+            Compressibility Phasicity Spontaneity Properties Summary  +---------+---------------+---------+-----------+----------+-------+  CFV       Full            Yes       Yes                             +---------+---------------+---------+-----------+----------+-------+  SFJ       Full                                                      +---------+---------------+---------+-----------+----------+-------+  FV Prox   Full                                                      +---------+---------------+---------+-----------+----------+-------+  FV Mid    Full                                                      +---------+---------------+---------+-----------+----------+-------+  FV Distal Full                                                      +---------+---------------+---------+-----------+----------+-------+  PFV       Full                                                      +---------+---------------+---------+-----------+----------+-------+  POP       Full            Yes       Yes                             +---------+---------------+---------+-----------+----------+-------+  PTV       Full                                                      +---------+---------------+---------+-----------+----------+-------+  PERO      Full                                                      +---------+---------------+---------+-----------+----------+-------+    Summary: Right: Findings consistent with acute deep vein thrombosis involving the right popliteal vein. Findings consistent with age indeterminate deep vein thrombosis involving the right femoral vein. No cystic structure found  in the  popliteal fossa. Left: There is no evidence of deep vein thrombosis in the lower extremity. No cystic structure found in the popliteal fossa.  *See table(s) above for measurements and observations. Electronically signed by Fabienne Brunsharles Fields MD on 11/07/2018 at 5:01:23 PM.    Final         Scheduled Meds:  cephALEXin  500 mg Oral Q8H   finasteride  5 mg Oral Daily   insulin aspart  0-15 Units Subcutaneous TID WC   insulin aspart  0-5 Units Subcutaneous QHS   insulin glargine  15 Units Subcutaneous Daily   multivitamin with minerals  1 tablet Oral Daily   pentosan polysulfate  100 mg Oral TID   tamsulosin  0.4 mg Oral QPC supper   Continuous Infusions:  sodium chloride 100 mL/hr (11/08/18 2051)   lactated ringers 100 mL/hr at 11/08/18 1452     LOS: 6 days     Huey Bienenstockawood Bethan Adamek, MD [facility]  If 7PM-7AM, please contact night-coverage [email or web address] 11/09/2018, 1:06 PM

## 2018-11-09 NOTE — Progress Notes (Signed)
1 Day Post-Op Subjective: Patient reports no complaints.   Objective: Vital signs in last 24 hours: Temp:  [97.6 F (36.4 C)-98.2 F (36.8 C)] 97.7 F (36.5 C) (03/11 0616) Pulse Rate:  [84-98] 93 (03/11 0616) Resp:  [12-22] 18 (03/11 0616) BP: (118-148)/(80-92) 135/80 (03/11 0616) SpO2:  [93 %-99 %] 96 % (03/11 0616)  Intake/Output from previous day: 03/10 0701 - 03/11 0700 In: 13948.7 [I.V.:3848.7; IV Piggyback:100] Out: 99242 [Urine:12525; Blood:10] Intake/Output this shift: Total I/O In: 6348.7 [I.V.:2348.7; Other:4000] Out: 7175 [Urine:7175]  Physical Exam:  Sleeping in bed No acute distress Abdomen soft and nontender No calf pain, swelling or tenderness Foley in place with clear to light pink urine on slow CBI  Lab Results: Recent Labs    11/07/18 0458 11/08/18 0538 11/09/18 0544  HGB 10.2* 9.7* 9.9*  HCT 33.4* 31.7* 31.0*   BMET Recent Labs    11/08/18 0538 11/09/18 0544  NA 137 137  K 3.6 3.6  CL 106 104  CO2 24 23  GLUCOSE 262* 273*  BUN 16 14  CREATININE 1.14 1.08  CALCIUM 8.0* 7.7*   No results for input(s): LABPT, INR in the last 72 hours. No results for input(s): LABURIN in the last 72 hours. Results for orders placed or performed during the hospital encounter of 11/03/18  Urine Culture     Status: Abnormal   Collection Time: 11/03/18  2:48 AM  Result Value Ref Range Status   Specimen Description   Final    URINE, CLEAN CATCH Performed at [facility], [address] [place], [place] [address]    Special Requests   Final    NONE Performed at [facility], [address] [place], [place] [address]    Culture >=100,000 COLONIES/mL STAPHYLOCOCCUS AUREUS (A)  Final   Report Status 11/05/2018 FINAL  Final   Organism ID, Bacteria STAPHYLOCOCCUS AUREUS (A)  Final      Susceptibility   Staphylococcus aureus - MIC*    CIPROFLOXACIN 2 INTERMEDIATE Intermediate     GENTAMICIN <=0.5 SENSITIVE Sensitive      NITROFURANTOIN <=16 SENSITIVE Sensitive     OXACILLIN <=0.25 SENSITIVE Sensitive     TETRACYCLINE <=1 SENSITIVE Sensitive     VANCOMYCIN <=0.5 SENSITIVE Sensitive     TRIMETH/SULFA <=10 SENSITIVE Sensitive     CLINDAMYCIN <=0.25 SENSITIVE Sensitive     RIFAMPIN <=0.5 SENSITIVE Sensitive     Inducible Clindamycin NEGATIVE Sensitive     * >=100,000 COLONIES/mL STAPHYLOCOCCUS AUREUS  Culture, blood (Routine X 2) w Reflex to ID Panel     Status: None   Collection Time: 11/03/18  6:29 AM  Result Value Ref Range Status   Specimen Description   Final    BLOOD RIGHT ANTECUBITAL Performed at [facility], [address] [place], [place] [address]    Special Requests   Final    BOTTLES DRAWN AEROBIC AND ANAEROBIC Blood Culture adequate volume Performed at [facility], [address], [place], [place] [address]    Culture   Final    NO GROWTH 5 DAYS Performed at [facility], [address], [place], [place] [address]    Report Status 11/08/2018 FINAL  Final  Culture, blood (Routine X 2) w Reflex to ID Panel     Status: None   Collection Time: 11/03/18  6:29 AM  Result Value Ref Range Status   Specimen Description   Final    BLOOD LEFT ANTECUBITAL Performed  at [facility], [address] [place], [place] [address]    Special Requests   Final    BOTTLES DRAWN AEROBIC AND ANAEROBIC Blood Culture adequate volume Performed at [facility], [address] [place], [place] [address]    Culture   Final    NO GROWTH 5 DAYS Performed at [facility], [address] [place], [place] [address]    Report Status 11/08/2018 FINAL  Final  Surgical pcr screen     Status: Abnormal   Collection Time: 11/08/18 12:50 PM  Result Value Ref Range Status   MRSA, PCR NEGATIVE NEGATIVE Final   Staphylococcus aureus POSITIVE (A) NEGATIVE Final    Comment: (NOTE) The Xpert SA Assay (FDA approved for NASAL  specimens in patients 40 years of age and older), is one component of a comprehensive surveillance program. It is not intended to diagnose infection nor to guide or monitor treatment. Performed at [facility], [address], [place], [place] [address]     Studies/Results: US Renal  Result Date: 11/08/2018 CLINICAL DATA:  Hydronephrosis. EXAM: RENAL / URINARY TRACT ULTRASOUND COMPLETE COMPARISON:  CT scan of November 03, 2018. FINDINGS: Right Kidney: Renal measurements: 14.2 x 7.4 x 7.0 cm = volume: 384 mL . Echogenicity within normal limits. Severe hydronephrosis is noted. No mass visualized. Left Kidney: Renal measurements: 13.6 x 6.9 x 5.8 cm = volume: 284 mL. Echogenicity within normal limits. Severe hydronephrosis is noted. No mass visualized. Bladder: Diffuse bladder wall thickening is noted. Large bladder calculus is noted. Foley catheter is noted. IMPRESSION: Severe bilateral hydronephrosis is noted. Large bladder calculus is noted with Foley catheter. Diffuse bladder wall thickening is noted and cystitis can not be excluded. Electronically Signed   By: Lupita Raider, M.D.   On: 11/08/2018 12:10   Vas [place] Lower Extremity Venous (dvt)  Result Date: 11/07/2018  Lower Venous Study Indications: History of DVT.  Performing Technologist: Chanda Busing RVT  Examination Guidelines: A complete evaluation includes B-mode imaging, spectral Doppler, color Doppler, and power Doppler as needed of all accessible portions of each vessel. Bilateral testing is considered an integral part of a complete examination. Limited examinations for reoccurring indications may be performed as noted.  Right Venous Findings: +---------+---------------+---------+-----------+----------+-----------------+          CompressibilityPhasicitySpontaneityPropertiesSummary           +---------+---------------+---------+-----------+----------+-----------------+ CFV      Full           Yes      Yes                                     +---------+---------------+---------+-----------+----------+-----------------+ SFJ      Full                                                           +---------+---------------+---------+-----------+----------+-----------------+ FV Prox  Partial                                      Age Indeterminate +---------+---------------+---------+-----------+----------+-----------------+ FV Mid   Partial  Age Indeterminate +---------+---------------+---------+-----------+----------+-----------------+ FV DistalPartial        Yes      Yes                  Age Indeterminate +---------+---------------+---------+-----------+----------+-----------------+ PFV      Full                                                           +---------+---------------+---------+-----------+----------+-----------------+ POP      Partial        Yes      Yes                  Acute             +---------+---------------+---------+-----------+----------+-----------------+ PTV      Full                                                           +---------+---------------+---------+-----------+----------+-----------------+ PERO     Full                                                           +---------+---------------+---------+-----------+----------+-----------------+  Left Venous Findings: +---------+---------------+---------+-----------+----------+-------+          CompressibilityPhasicitySpontaneityPropertiesSummary +---------+---------------+---------+-----------+----------+-------+ CFV      Full           Yes      Yes                          +---------+---------------+---------+-----------+----------+-------+ SFJ      Full                                                 +---------+---------------+---------+-----------+----------+-------+ FV Prox  Full                                                  +---------+---------------+---------+-----------+----------+-------+ FV Mid   Full                                                 +---------+---------------+---------+-----------+----------+-------+ FV DistalFull                                                 +---------+---------------+---------+-----------+----------+-------+ PFV      Full                                                 +---------+---------------+---------+-----------+----------+-------+  POP      Full           Yes      Yes                          +---------+---------------+---------+-----------+----------+-------+ PTV      Full                                                 +---------+---------------+---------+-----------+----------+-------+ PERO     Full                                                 +---------+---------------+---------+-----------+----------+-------+    Summary: Right: Findings consistent with acute deep vein thrombosis involving the right popliteal vein. Findings consistent with age indeterminate deep vein thrombosis involving the right femoral vein. No cystic structure found in the popliteal fossa. Left: There is no evidence of deep vein thrombosis in the lower extremity. No cystic structure found in the popliteal fossa.  *See table(s) above for measurements and observations. Electronically signed by Fabienne Bruns MD on 11/07/2018 at 5:01:23 PM.    Final     Assessment/Plan: #1 gross hematuria-resolved. Hgb stable today. Ok to start anticoagulation tomorrow. D/c CBI.   #2 bilateral hydronephrosis-US showed continued severe hydronephrosis and retrogrades showed obstruction by thickened bladder wall.  Stents placed November 08, 2018. I'll remove the stents in 3 weeks in office.   #3 BPH with weak stream and likely overflow incontinence-tamsulosin and finasteride started. Status post TURP November 08, 2018. Void trial tomorrow morning and home if stable.   #4bladder  stone-Status post laser cystolitholapaxy November 08, 2018.  #5UTI-Culture grew greater than 100,000 staph aureus. He is on amoxicillin.   LOS: 6 days   [name] 11/09/2018, 6:32 AM

## 2018-11-09 NOTE — TOC Initial Note (Signed)
Transition of Care [facility]) - Initial/Assessment Note    Patient Details  Name: Timothy Walsh MRN: [id number] Date of Birth: [birth year]  [facility] CM/SW Contact:    Lanier Clam, RN Phone Number:[phone] 11/09/2018, 2:21 PM  Clinical Narrative:  Patient has on prescription coverage for meds. He can receive the 30day discount card for either xarelto/eliquis. He will get a pcp appt(CM to provide pcp listing-patient will make own appt) he will be able to f/u with pcp for ongoing meds asst-samples, & patient asst program. Patient voiced understanding.Updated attending, & pharmacy.                       Patient Goals and CMS Choice-to get medicare part D coverage when open enrollement is available.        Expected Discharge Plan and Services_d/c plan home.         Expected d/c date:11/11/18                        Prior Living Arrangements/Services-patient lives w/spouse.Indep PTA.                       Activities of Daily Living Home Assistive Devices/Equipment: CBG Meter, Cane (specify quad or straight), Dentures (specify type)(quad cane, upper partial plate) ADL Screening (condition at time of admission) Patient's cognitive ability adequate to safely complete daily activities?: Yes Is the patient deaf or have difficulty hearing?: No Does the patient have difficulty seeing, even when wearing glasses/contacts?: No Does the patient have difficulty concentrating, remembering, or making decisions?: No Patient able to express need for assistance with ADLs?: Yes Does the patient have difficulty dressing or bathing?: No Independently performs ADLs?: Yes (appropriate for developmental age) Does the patient have difficulty walking or climbing stairs?: Yes(secondary to back and right leg pain) Weakness of Legs: Right Weakness of Arms/Hands: None  Permission Sought/Granted-patient a+ox3. He has given permission to discuss d/c plans with his spouse.                   Emotional Assessment-calm.              Admission diagnosis:  Lower urinary tract infectious disease [N39.0] Gross hematuria [R31.0] Sepsis due to urinary tract infection (HCC) [A41.9, N39.0] Patient Active Problem List   Diagnosis Date Noted  . Complicated UTI (urinary tract infection) 11/03/2018  . Sepsis due to urinary tract infection (HCC) 11/03/2018  . Calculus of urinary bladder 11/03/2018  . Bilateral hydronephrosis 11/03/2018  . DKA (diabetic ketoacidoses) (HCC) 11/03/2018  . DM II (diabetes mellitus, type II), controlled (HCC) 11/03/2018  . Hematuria 11/03/2018  . Chronic anticoagulation 11/03/2018  . CAD (coronary artery disease) 11/03/2018  . Essential hypertension 11/03/2018   PCP:  Patient, No Pcp Per Pharmacy:   [facility] [address] [place] [address] [place] [address] Phone: [phone] Fax: [phone]     Social Determinants of Health (SDOH) Interventions    Readmission Risk Interventions 30 Day Unplanned Readmission Risk Score     ED to Hosp-Admission (Current) from 11/03/2018 in [facility] TELEMETRY/UROLOGY [facility]  30 Day Unplanned Readmission Risk Score (%)  10 Filed at 11/09/2018 1200     This score is the patient's risk of an unplanned readmission within 30 days of being discharged (0 -100%). The score is based on dignosis, age, lab data, medications, orders, and past  utilization.   Low:  0-14.9   Medium: 15-21.9   High: 22-29.9   Extreme: 30 and above       No flowsheet data found.

## 2018-11-10 LAB — BASIC METABOLIC PANEL
Anion gap: 7 (ref 5–15)
BUN: 15 mg/dL (ref 8–23)
CHLORIDE: 101 mmol/L (ref 98–111)
CO2: 27 mmol/L (ref 22–32)
Calcium: 8 mg/dL — ABNORMAL LOW (ref 8.9–10.3)
Creatinine, Ser: 1.13 mg/dL (ref 0.61–1.24)
GFR calc Af Amer: >60 mL/min (ref >60)
GFR calc non Af Amer: >60 mL/min (ref >60)
Glucose, Bld: 151 mg/dL — ABNORMAL HIGH (ref 70–99)
Potassium: 3.3 mmol/L — ABNORMAL LOW (ref 3.5–5.1)
Sodium: 135 mmol/L (ref 135–145)

## 2018-11-10 LAB — CBC
HCT: 35.5 % — ABNORMAL LOW (ref 39.0–52.0)
HEMOGLOBIN: 10.9 g/dL — AB (ref 13.0–17.0)
MCH: 28.3 pg (ref 26.0–34.0)
MCHC: 30.7 g/dL (ref 30.0–36.0)
MCV: 92.2 fL (ref 80.0–100.0)
Platelets: 310 10*3/uL (ref 150–400)
RBC: 3.85 MIL/uL — ABNORMAL LOW (ref 4.22–5.81)
RDW: 13.4 % (ref 11.5–15.5)
WBC: 9.5 10*3/uL (ref 4.0–10.5)
nRBC: 0 % (ref 0.0–0.2)

## 2018-11-10 MED ORDER — POTASSIUM CHLORIDE CRYS ER 20 MEQ PO TBCR
40.0000 meq | EXTENDED_RELEASE_TABLET | Freq: Once | ORAL | Status: AC
  Administered 2018-11-10: 40 meq via ORAL
  Filled 2018-11-10: qty 2

## 2018-11-10 MED ORDER — RIVAROXABAN 20 MG PO TABS
20.0000 mg | ORAL_TABLET | Freq: Every day | ORAL | Status: DC
  Administered 2018-11-10 – 2018-11-11 (×2): 20 mg via ORAL
  Filled 2018-11-10 (×2): qty 1

## 2018-11-10 MED ORDER — INSULIN GLARGINE 100 UNIT/ML ~~LOC~~ SOLN
18.0000 [IU] | Freq: Every day | SUBCUTANEOUS | Status: DC
  Administered 2018-11-10: 18 [IU] via SUBCUTANEOUS
  Filled 2018-11-10 (×2): qty 0.18

## 2018-11-10 NOTE — Care Management Important Message (Signed)
Important Message  Patient Details  Name: Timothy Walsh MRN: [id number] Date of Birth: 23-Feb-1951   Medicare Important Message Given:  Yes    Caren Macadam 11/10/2018, 10:54 AMImportant Message  Patient Details  Name: Timothy Walsh MRN: [id number] Date of Birth: April 08, 1951   Medicare Important Message Given:  Yes    Caren Macadam 11/10/2018, 10:54 AM

## 2018-11-10 NOTE — Progress Notes (Signed)
PROGRESS NOTE    Timothy Walsh  WUJ:[id number] DOB: February 08, 1951 DOA: 11/03/2018 PCP: Patient, No Pcp Per   Brief Narrative:  68 year old with history of CAD, history of MI, diabetes mellitus type 2, essential hypertension came to the hospital with complains of urinary burning and hematuria.  On CT of the abdomen was noted to have bilateral hydroureteronephrosis with renal stone and enlarged prostate.  He was seen by urology Dr. Mena Goes, status post cystoscopy, cystoscopy,cystolitholopaxy with holmium laser 3.3 cm stone, transurethral resection of prostate, bilateral retrograde pyelogram, bilateral ureteral stent placement, and his gross hematuria anticoagulation has been held, requiring CBI postoperatively, his urine culture growing MSSA,, Foley catheter discontinued 11/10/2018, no further hematuria, appears to be improving.   Subjective:  Reports he is feeling better today, Foley catheter discontinued today, he is having brisk diuresis, .  Assessment & Plan:   Active Problems:   Complicated UTI (urinary tract infection)   Sepsis due to urinary tract infection (HCC)   Calculus of urinary bladder   Bilateral hydronephrosis   DKA (diabetic ketoacidoses) (HCC)   DM II (diabetes mellitus, type II), controlled (HCC)   Hematuria   Chronic anticoagulation   CAD (coronary artery disease)   Essential hypertension  Bilateral hydronephrosis secondary to bladder stone and gross hematuria, persistent Benign prostatic hyperplasia -Ultrasound showed continued severe hydronephrosis and with retrograde showed obstruction with thickened bladder wall, . - status post cystoscopy, cystoscopy,cystolitholopaxy with holmium laser 3.3 cm stone, transurethral resection of prostate, bilateral retrograde pyelogram, bilateral ureteral stent placement by Dr. Mena Goes 11/08/2018. -[place] to remove stent in 2 to 3 weeks in his office, -Catheter was discontinued today, he is having brisk post obstructive resolve  diuresis, discussed with urology, will monitor his ins and outs closely for dehydration given large output, will repeat labs in a.m.Marland Kitchen  MSSA urinary tract infection - Currently on amoxicillin, and switching to Keflex  Gross hematuria - CBI after surgery, hematuria has resolved, will monitor closely now he is back on Xarelto  BPH -Foley in place, Flomax and finasteride continued.  Status post TURP November 08, 2018,   Acute blood loss anemia, normocytic - Baseline hemoglobin around 12.0.  This 10.9 today, continue to monitor   History of coronary artery disease -Currently patient is chest pain-free.  Stop ASA for now as well.   Diabetes mellitus type 2, uncontrolled -Insulin sliding scale and Accu-Chek.  Metformin on hold.  Ms. 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  Consultants:   Urology  Procedures:   None  Antimicrobials:   Rocephin day 2, stopped  Vancomycin day 1, stopped  Amoxicillin day 3     Objective: Vitals:   11/09/18 2146 11/10/18 0233 11/10/18 0543 11/10/18 1247  BP: (!) 155/88 (!) 146/89 (!) 144/86 (!) 146/88  Pulse: 92 89 93 92  Resp: 18 18 18  (!) 21  Temp: 98.8 F (37.1 C) 98.2 F (36.8 C) 98.7 F (37.1 C) 98 F (36.7 C)  TempSrc: Oral Oral Oral Oral  SpO2: 96% 93% 94% 95%  Weight:      Height:        Intake/Output Summary (Last 24 hours) at 11/10/2018 1506 Last data filed at 11/10/2018 0900 Gross per 24 hour  Intake 840 ml  Output 2800 ml  Net -1960  ml   Filed Weights   11/04/18 0427  Weight: 92.9 kg    Examination:  Awake Alert, Oriented X 3, No new F.N deficits, Normal affect Symmetrical Chest wall movement, Good air movement bilaterally, CTAB RRR,No  Gallops,Rubs or new Murmurs, No Parasternal Heave +ve B.Sounds, Abd Soft, No tenderness, No rebound - guarding or rigidity. No Cyanosis, Clubbing or edema, No new Rash or bruise      Data Reviewed:   CBC: Recent Labs  Lab 11/06/18 0527 11/07/18 0458 11/08/18 0538 11/09/18 0544 11/10/18 0540  WBC 7.8 7.6 9.4 9.6 9.5  HGB 11.5* 10.2* 9.7* 9.9* 10.9*  HCT 37.3* 33.4* 31.7* 31.0* 35.5*  MCV 92.3 92.5 93.0 90.9 92.2  PLT 200 207 250 267 310   Basic Metabolic Panel: Recent Labs  Lab 11/05/18 0825 11/06/18 0527 11/07/18 0458 11/08/18 0538 11/09/18 0544 11/10/18 0540  NA 135 134* 137 137 137 135  K 3.2* 4.0 3.7 3.6 3.6 3.3*  CL 109 105 109 106 104 101  CO2 GLUCOSE 218* 288* 213* 262* 273* 151*  BUN CREATININE 1.09 1.29* 1.07 1.14 1.08 1.13  CALCIUM 7.6* 7.8* 7.7* 8.0* 7.7* 8.0*  MG 1.8 1.9 1.7 1.7 1.6*  --    GFR: Estimated Creatinine Clearance: 72.4 mL/min (by C-G formula based on SCr of 1.13 mg/dL). Liver Function Tests: No results for input(s): AST, ALT, ALKPHOS, BILITOT, PROT, ALBUMIN in the last 168 hours. No results for input(s): LIPASE, AMYLASE in the last 168 hours. No results for input(s): AMMONIA in the last 168 hours. Coagulation Profile: Recent Labs  Lab 11/04/18 0536  INR 1.3*   Cardiac Enzymes: No results for input(s): CKTOTAL, CKMB, CKMBINDEX, TROPONINI in the last 168 hours. BNP (last 3 results) No results for input(s): PROBNP in the last 8760 hours. HbA1C: No results for input(s): HGBA1C in the last 72 hours. CBG: Recent Labs  Lab 11/08/18 2218 11/09/18 0747 11/09/18 1145 11/09/18 1701 11/09/18 2148  GLUCAP 193* 245* 217* 210* 262*   Lipid Profile: No results for input(s): CHOL, HDL, LDLCALC, TRIG, CHOLHDL, LDLDIRECT in the last 72 hours. Thyroid Function Tests: No results for input(s): TSH, T4TOTAL, FREET4, T3FREE, THYROIDAB in the last 72 hours. Anemia Panel: No results for input(s): VITAMINB12,  FOLATE, FERRITIN, TIBC, IRON, RETICCTPCT in the last 72 hours. Sepsis Labs: No results for input(s): PROCALCITON, LATICACIDVEN in the last 168 hours.  Recent Results (from the past 240 hour(s))  Urine Culture     Status: Abnormal   Collection Time: 11/03/18  2:48 AM  Result Value Ref Range Status   Specimen Description   Final    URINE, CLEAN CATCH Performed at [facility], [address] [place], [place] [address]    Special Requests   Final    NONE Performed at [facility], [address], [place], [place] [address]    Culture >=100,000 COLONIES/mL STAPHYLOCOCCUS AUREUS (A)  Final   Report Status 11/05/2018 FINAL  Final   Organism ID, Bacteria STAPHYLOCOCCUS AUREUS (A)  Final      Susceptibility   Staphylococcus aureus - MIC*    CIPROFLOXACIN 2 INTERMEDIATE Intermediate     GENTAMICIN <=0.5 SENSITIVE Sensitive     NITROFURANTOIN <=16 SENSITIVE Sensitive     OXACILLIN <=0.25 SENSITIVE Sensitive     TETRACYCLINE <=1 SENSITIVE Sensitive     VANCOMYCIN <=0.5 SENSITIVE Sensitive     TRIMETH/SULFA <=10 SENSITIVE Sensitive  CLINDAMYCIN <=0.25 SENSITIVE Sensitive     RIFAMPIN <=0.5 SENSITIVE Sensitive     Inducible Clindamycin NEGATIVE Sensitive     * >=100,000 COLONIES/mL STAPHYLOCOCCUS AUREUS  Culture, blood (Routine X 2) w Reflex to ID Panel     Status: None   Collection Time: 11/03/18  6:29 AM  Result Value Ref Range Status   Specimen Description   Final    BLOOD RIGHT ANTECUBITAL Performed at [facility], [address] [place], [place] [address]    Special Requests   Final    BOTTLES DRAWN AEROBIC AND ANAEROBIC Blood Culture adequate volume Performed at [facility], [address] [place], [place] [address]    Culture   Final    NO GROWTH 5 DAYS Performed at [facility], [address] [place], [place] [address]    Report Status 11/08/2018 FINAL  Final  Culture, blood (Routine  X 2) w Reflex to ID Panel     Status: None   Collection Time: 11/03/18  6:29 AM  Result Value Ref Range Status   Specimen Description   Final    BLOOD LEFT ANTECUBITAL Performed at [facility], [address], [place], [place] [address]    Special Requests   Final    BOTTLES DRAWN AEROBIC AND ANAEROBIC Blood Culture adequate volume Performed at [facility], [address] [place], [place] [address]    Culture   Final    NO GROWTH 5 DAYS Performed at [facility], [address], [place], [place] [address]    Report Status 11/08/2018 FINAL  Final  Surgical pcr screen     Status: Abnormal   Collection Time: 11/08/18 12:50 PM  Result Value Ref Range Status   MRSA, PCR NEGATIVE NEGATIVE Final   Staphylococcus aureus POSITIVE (A) NEGATIVE Final    Comment: (NOTE) The Xpert SA Assay (FDA approved for NASAL specimens in patients 85 years of age and older), is one component of a comprehensive surveillance program. It is not intended to diagnose infection nor to guide or monitor treatment. Performed at [facility], [address] [place], [place] [address]          Radiology Studies: No results found.      Scheduled Meds:  cephALEXin  500 mg Oral Q8H   finasteride  5 mg Oral Daily   insulin aspart  0-15 Units Subcutaneous TID WC   insulin aspart  0-5 Units Subcutaneous QHS   insulin glargine  18 Units Subcutaneous Daily   multivitamin with minerals  1 tablet Oral Daily   pentosan polysulfate  100 mg Oral TID   rivaroxaban  20 mg Oral Q breakfast   tamsulosin  0.4 mg Oral QPC supper   Continuous Infusions:  sodium chloride 100 mL/hr (11/08/18 2051)   lactated ringers 100 mL/hr at 11/08/18 1452     LOS: 7 days     Huey Bienenstock, MD [facility]  If 7PM-7AM, please contact night-coverage [email or web address] 11/10/2018, 3:06 PM

## 2018-11-10 NOTE — Progress Notes (Signed)
2 Days Post-Op Subjective: Patient reports feeling well other than a headache. He was seen about 9 AM during the [facility] downtime.   Objective: Vital signs in last 24 hours: Temp:  [97.8 F (36.6 C)-98.8 F (37.1 C)] 98.7 F (37.1 C) (03/12 0543) Pulse Rate:  [89-93] 93 (03/12 0543) Resp:  [18] 18 (03/12 0543) BP: (138-155)/(86-89) 144/86 (03/12 0543) SpO2:  [93 %-97 %] 94 % (03/12 0543)  Intake/Output from previous day: 03/11 0701 - 03/12 0700 In: 1200 [P.O.:1200] Out: 5400 [Urine:5400] Intake/Output this shift: No intake/output data recorded.  Physical Exam:  Watching TV NAD Watching TV Alert and oriented  Abd - soft, NT Urine - light pink in tube - no clots.   Foley d/c'd by me.   Lab Results: Recent Labs    11/08/18 0538 11/09/18 0544 11/10/18 0540  HGB 9.7* 9.9* 10.9*  HCT 31.7* 31.0* 35.5*   BMET Recent Labs    11/09/18 0544 11/10/18 0540  NA 137 135  K 3.6 3.3*  CL 104 101  CO2 23 27  GLUCOSE 273* 151*  BUN 14 15  CREATININE 1.08 1.13  CALCIUM 7.7* 8.0*   No results for input(s): LABPT, INR in the last 72 hours. No results for input(s): LABURIN in the last 72 hours. Results for orders placed or performed during the hospital encounter of 11/03/18  Urine Culture     Status: Abnormal   Collection Time: 11/03/18  2:48 AM  Result Value Ref Range Status   Specimen Description   Final    URINE, CLEAN CATCH Performed at [facility], [address] [place], [place] [address]    Special Requests   Final    NONE Performed at [facility], [address] [place], [place] [address]    Culture >=100,000 COLONIES/mL STAPHYLOCOCCUS AUREUS (A)  Final   Report Status 11/05/2018 FINAL  Final   Organism ID, Bacteria STAPHYLOCOCCUS AUREUS (A)  Final      Susceptibility   Staphylococcus aureus - MIC*    CIPROFLOXACIN 2 INTERMEDIATE Intermediate     GENTAMICIN <=0.5 SENSITIVE Sensitive     NITROFURANTOIN <=16 SENSITIVE  Sensitive     OXACILLIN <=0.25 SENSITIVE Sensitive     TETRACYCLINE <=1 SENSITIVE Sensitive     VANCOMYCIN <=0.5 SENSITIVE Sensitive     TRIMETH/SULFA <=10 SENSITIVE Sensitive     CLINDAMYCIN <=0.25 SENSITIVE Sensitive     RIFAMPIN <=0.5 SENSITIVE Sensitive     Inducible Clindamycin NEGATIVE Sensitive     * >=100,000 COLONIES/mL STAPHYLOCOCCUS AUREUS  Culture, blood (Routine X 2) w Reflex to ID Panel     Status: None   Collection Time: 11/03/18  6:29 AM  Result Value Ref Range Status   Specimen Description   Final    BLOOD RIGHT ANTECUBITAL Performed at [facility], [address] [place], [place] [address]    Special Requests   Final    BOTTLES DRAWN AEROBIC AND ANAEROBIC Blood Culture adequate volume Performed at [facility], [address], [place], [place] [address]    Culture   Final    NO GROWTH 5 DAYS Performed at [facility], [address] [place], [place] [address]    Report Status 11/08/2018 FINAL  Final  Culture, blood (Routine X 2) w Reflex to ID Panel     Status: None   Collection Time: 11/03/18  6:29 AM  Result Value Ref Range Status   Specimen Description   Final  BLOOD LEFT ANTECUBITAL Performed at [facility], [address], [place], [place] [address]    Special Requests   Final    BOTTLES DRAWN AEROBIC AND ANAEROBIC Blood Culture adequate volume Performed at [facility], [address], [place], [place] [address]    Culture   Final    NO GROWTH 5 DAYS Performed at [facility], [address], [place], [place] [address]    Report Status 11/08/2018 FINAL  Final  Surgical pcr screen     Status: Abnormal   Collection Time: 11/08/18 12:50 PM  Result Value Ref Range Status   MRSA, PCR NEGATIVE NEGATIVE Final   Staphylococcus aureus POSITIVE (A) NEGATIVE Final    Comment: (NOTE) The Xpert SA Assay (FDA approved for NASAL specimens in patients 54 years of age  and older), is one component of a comprehensive surveillance program. It is not intended to diagnose infection nor to guide or monitor treatment. Performed at [facility], [address], [place], [place] [address]     Studies/Results: US Renal  Result Date: 11/08/2018 CLINICAL DATA:  Hydronephrosis. EXAM: RENAL / URINARY TRACT ULTRASOUND COMPLETE COMPARISON:  CT scan of November 03, 2018. FINDINGS: Right Kidney: Renal measurements: 14.2 x 7.4 x 7.0 cm = volume: 384 mL . Echogenicity within normal limits. Severe hydronephrosis is noted. No mass visualized. Left Kidney: Renal measurements: 13.6 x 6.9 x 5.8 cm = volume: 284 mL. Echogenicity within normal limits. Severe hydronephrosis is noted. No mass visualized. Bladder: Diffuse bladder wall thickening is noted. Large bladder calculus is noted. Foley catheter is noted. IMPRESSION: Severe bilateral hydronephrosis is noted. Large bladder calculus is noted with Foley catheter. Diffuse bladder wall thickening is noted and cystitis can not be excluded. Electronically Signed   By: Lupita Raider, M.D.   On: 11/08/2018 12:10    Assessment/Plan: #1 gross hematuria-resolved. Hgb stable today. Ok to start anticoagulation. Discussed with patient he is higher risk of bleeding given stents, post-op status.    #2 bilateral hydronephrosis-US showed continued severe hydronephrosis and retrogrades showed obstruction by thickened bladder wall. Stents placed November 08, 2018. I'll remove the stents in 2-3 weeks in office. He has an excellent diuresis going on.   #3 BPH with weak stream and likely overflow incontinence- tamsulosin and finasteride started. Status post TURP November 08, 2018. Void trial today. Will plan to see him in office next week for a check in the office.   #4 bladder stone- resolved, status post laser cystolitholapaxy November 08, 2018.   #5 UTI- Culture grew greater than 100,000 staph aureus. He is on amoxicillin.    LOS: 7 days    [name] 11/10/2018, 8:10 AM

## 2018-11-11 LAB — CBC
HCT: 35.3 % — ABNORMAL LOW (ref 39.0–52.0)
Hemoglobin: 10.8 g/dL — ABNORMAL LOW (ref 13.0–17.0)
MCH: 28.2 pg (ref 26.0–34.0)
MCHC: 30.6 g/dL (ref 30.0–36.0)
MCV: 92.2 fL (ref 80.0–100.0)
Platelets: 317 10*3/uL (ref 150–400)
RBC: 3.83 MIL/uL — ABNORMAL LOW (ref 4.22–5.81)
RDW: 13.3 % (ref 11.5–15.5)
WBC: 11.2 10*3/uL — ABNORMAL HIGH (ref 4.0–10.5)
nRBC: 0 % (ref 0.0–0.2)

## 2018-11-11 LAB — GLUCOSE, CAPILLARY
GLUCOSE-CAPILLARY: 296 mg/dL — AB (ref 70–99)
Glucose-Capillary: 222 mg/dL — ABNORMAL HIGH (ref 70–99)
Glucose-Capillary: 297 mg/dL — ABNORMAL HIGH (ref 70–99)
Glucose-Capillary: 217 mg/dL — ABNORMAL HIGH (ref 70–99)
Glucose-Capillary: 167 mg/dL — ABNORMAL HIGH (ref 70–99)
Glucose-Capillary: 229 mg/dL — ABNORMAL HIGH (ref 70–99)

## 2018-11-11 LAB — BASIC METABOLIC PANEL
Anion gap: 10 (ref 5–15)
BUN: 20 mg/dL (ref 8–23)
CO2: 25 mmol/L (ref 22–32)
Calcium: 8 mg/dL — ABNORMAL LOW (ref 8.9–10.3)
Chloride: 101 mmol/L (ref 98–111)
Creatinine, Ser: 1.25 mg/dL — ABNORMAL HIGH (ref 0.61–1.24)
GFR calc Af Amer: >60 mL/min (ref >60)
GFR calc non Af Amer: 60 mL/min — ABNORMAL LOW (ref >60)
GLUCOSE: 200 mg/dL — AB (ref 70–99)
Potassium: 3.8 mmol/L (ref 3.5–5.1)
Sodium: 136 mmol/L (ref 135–145)

## 2018-11-11 MED ORDER — RIVAROXABAN 20 MG PO TABS: 20.0000 mg | ORAL_TABLET | Freq: Every day | ORAL | 0 refills | Status: AC

## 2018-11-11 MED ORDER — METFORMIN HCL 1000 MG PO TABS: 1000.0000 mg | ORAL_TABLET | Freq: Two times a day (BID) | ORAL | 0 refills | Status: AC

## 2018-11-11 MED ORDER — CEPHALEXIN 500 MG PO CAPS: 500.0000 mg | ORAL_CAPSULE | Freq: Three times a day (TID) | ORAL | 0 refills | Status: AC

## 2018-11-11 MED ORDER — INSULIN GLARGINE 100 UNIT/ML ~~LOC~~ SOLN
24.0000 [IU] | Freq: Every day | SUBCUTANEOUS | Status: DC
  Administered 2018-11-11: 24 [IU] via SUBCUTANEOUS
  Filled 2018-11-11: qty 0.24

## 2018-11-11 MED ORDER — ACETAMINOPHEN 325 MG PO TABS: 650.0000 mg | ORAL_TABLET | Freq: Four times a day (QID) | ORAL | Status: AC | PRN

## 2018-11-11 MED ORDER — CEPHALEXIN 500 MG PO CAPS: 500.0000 mg | ORAL_CAPSULE | Freq: Three times a day (TID) | ORAL | 0 refills | Status: DC

## 2018-11-11 NOTE — Progress Notes (Signed)
3 Days Post-Op Subjective: Patient reports he is not voiding well and having incontinence.  Elevated post void last night.  Objective: Vital signs in last 24 hours: Temp:  [97.7 F (36.5 C)-98.2 F (36.8 C)] 97.7 F (36.5 C) (03/13 0614) Pulse Rate:  [82-92] 91 (03/13 0614) Resp:  [16-21] 18 (03/13 0614) BP: (146-151)/(84-91) 151/91 (03/13 0614) SpO2:  [91 %-95 %] 93 % (03/13 0614)  Intake/Output from previous day: 03/12 0701 - 03/13 0700 In: 600 [P.O.:600] Out: 600 [Urine:600] Intake/Output this shift: No intake/output data recorded.  Physical Exam:  He is alert and oriented watching TV Abdomen soft and nontender, bladder distended GU-Foley catheter in place  Procedure: The condom catheter was removed.  The penis was prepped and an 29 [place] coud catheter was placed without difficulty.  20 cc was put in the balloon.  He drained 1400 cc of red to purple-colored urine.  No clots.  Thin.  Lab Results: Recent Labs    11/09/18 0544 11/10/18 0540 11/11/18 0551  HGB 9.9* 10.9* 10.8*  HCT 31.0* 35.5* 35.3*   BMET Recent Labs    11/10/18 0540 11/11/18 0551  NA 135 136  K 3.3* 3.8  CL 101 101  CO2 27 25  GLUCOSE 151* 200*  BUN 15 20  CREATININE 1.13 1.25*  CALCIUM 8.0* 8.0*   No results for input(s): LABPT, INR in the last 72 hours. No results for input(s): LABURIN in the last 72 hours. Results for orders placed or performed during the hospital encounter of 11/03/18  Urine Culture     Status: Abnormal   Collection Time: 11/03/18  2:48 AM  Result Value Ref Range Status   Specimen Description   Final    URINE, CLEAN CATCH Performed at [facility], [address], [place], [place] [address]    Special Requests   Final    NONE Performed at [facility], [address], [place], [place] [address]    Culture >=100,000 COLONIES/mL STAPHYLOCOCCUS AUREUS (A)  Final   Report Status 11/05/2018 FINAL  Final   Organism ID, Bacteria  STAPHYLOCOCCUS AUREUS (A)  Final      Susceptibility   Staphylococcus aureus - MIC*    CIPROFLOXACIN 2 INTERMEDIATE Intermediate     GENTAMICIN <=0.5 SENSITIVE Sensitive     NITROFURANTOIN <=16 SENSITIVE Sensitive     OXACILLIN <=0.25 SENSITIVE Sensitive     TETRACYCLINE <=1 SENSITIVE Sensitive     VANCOMYCIN <=0.5 SENSITIVE Sensitive     TRIMETH/SULFA <=10 SENSITIVE Sensitive     CLINDAMYCIN <=0.25 SENSITIVE Sensitive     RIFAMPIN <=0.5 SENSITIVE Sensitive     Inducible Clindamycin NEGATIVE Sensitive     * >=100,000 COLONIES/mL STAPHYLOCOCCUS AUREUS  Culture, blood (Routine X 2) w Reflex to ID Panel     Status: None   Collection Time: 11/03/18  6:29 AM  Result Value Ref Range Status   Specimen Description   Final    BLOOD RIGHT ANTECUBITAL Performed at [facility], [address], [place], [place] [address]    Special Requests   Final    BOTTLES DRAWN AEROBIC AND ANAEROBIC Blood Culture adequate volume Performed at [facility], [address] [place], [place] [address]    Culture   Final    NO GROWTH 5 DAYS Performed at [facility], [address], [place], [place] [address]    Report Status 11/08/2018 FINAL  Final  Culture, blood (Routine X 2) w Reflex  to ID Panel     Status: None   Collection Time: 11/03/18  6:29 AM  Result Value Ref Range Status   Specimen Description   Final    BLOOD LEFT ANTECUBITAL Performed at [facility], [address] [place], [place] [address]    Special Requests   Final    BOTTLES DRAWN AEROBIC AND ANAEROBIC Blood Culture adequate volume Performed at [facility], [address] [place], [place] [address]    Culture   Final    NO GROWTH 5 DAYS Performed at [facility], [address], [place], [place] [address]    Report Status 11/08/2018 FINAL  Final  Surgical pcr screen     Status: Abnormal   Collection Time: 11/08/18 12:50 PM  Result Value  Ref Range Status   MRSA, PCR NEGATIVE NEGATIVE Final   Staphylococcus aureus POSITIVE (A) NEGATIVE Final    Comment: (NOTE) The Xpert SA Assay (FDA approved for NASAL specimens in patients 23 years of age and older), is one component of a comprehensive surveillance program. It is not intended to diagnose infection nor to guide or monitor treatment. Performed at [facility], [address], [place], [place] [address]     Studies/Results: No results found.  Assessment/Plan: #1 gross hematuria-resolved. Hgb stable today. On Xarelto. Some hematuria expected with foley, stents, s/p TURP, etc. No clots and Heg stable. Discussed with patient he is higher risk of bleeding given stents, post-op status and to drink plenty of fluids.   #2 bilateral hydronephrosis-US showed continued severe hydronephrosis and retrogrades showed obstruction by thickened bladder wall. Stents placed November 08, 2018. I'll remove the stents in 2-3 weeks in office. He has an excellent diuresis going on.   #3 urinary retention - foley replaced - Will plan to see him in office next week for void trial / foley removal.    #4 BPH with weak stream and likely overflow incontinence- tamsulosin and finasteride started. Status post TURP November 08, 2018.   #5 bladder stone- resolved, status post laser cystolitholapaxy November 08, 2018.    LOS: 8 days   [name] 11/11/2018, 9:42 AM

## 2018-11-11 NOTE — Discharge Summary (Signed)
Timothy Walsh, is a 68 y.o. male  DOB Apr 18, 1951  MRN [id number].  Admission date:  11/03/2018  Admitting Physician  [name] [name], DO  Discharge Date:  11/11/2018   Primary MD  Patient, No Pcp Per  Recommendations for primary care physician for things to follow:  -Check CBC, BMP during next visit -To follow with urology as an outpatient regarding Foley catheter and further neurologic care   Admission Diagnosis  Lower urinary tract infectious disease [N39.0] Gross hematuria [R31.0] Sepsis due to urinary tract infection (HCC) [A41.9, N39.0]   Discharge Diagnosis  Lower urinary tract infectious disease [N39.0] Gross hematuria [R31.0] Sepsis due to urinary tract infection (HCC) [A41.9, N39.0]    Active Problems:   Complicated UTI (urinary tract infection)   Sepsis due to urinary tract infection (HCC)   Calculus of urinary bladder   Bilateral hydronephrosis   DKA (diabetic ketoacidoses) (HCC)   DM II (diabetes mellitus, type II), controlled (HCC)   Hematuria   Chronic anticoagulation   CAD (coronary artery disease)   Essential hypertension      Past Medical History:  Diagnosis Date   Diabetes mellitus without complication (HCC)    Hypertension    MI, old     Past Surgical History:  Procedure Laterality Date   CYSTOSCOPY WITH LITHOLAPAXY N/A 11/08/2018   Procedure: TRANSURETHRAL RESECTION OF PROSTATE, BILATERAL URETERAL STENTS, CYSTOSCOPY WITH HOLMIUM LASER  LITHOLAPAXY 3.3CM, BILATERAL RETROGRADE PYELOGRAM;  Surgeon: [name], MD;  Location: [facility];  Service: Urology;  Laterality: N/A;   HERNIA REPAIR         History of present illness and  Hospital Course:     Kindly see H&P for history of present illness and admission details, please review complete Labs, Consult reports and Test reports for all details in brief  HPI  from the history and physical done on the day of  admission 11/03/2018  HPI: The patient is a 68 yr old man who states that he has had 3 days of burning with urination, then this morning he noticed that he was urinating blood. He is also feeling weak and fatigued. He denies fevers or chills, nausea or vomiting. He has noticed urinary hesitancy as well.  The patient has a past medical history significant for DM II, hypertension, CAD, History of MI.  In the ED the patient had Blood pressure (!) 151/93, pulse (!) 101, temperature 98.9 F (37.2 C), temperature source Oral, resp. rate 16, SpO2 98 %.  His sodium was 136. potassium was 3.7. BUN was 1.38. Lactic acid was 2.4. Beta hydroxybutyrate was elevated at 0.95. Glucose was 451. UA demonstrated many bacteria, positive leukocyte esterase, and large blood.   A CT renal stone study was obtained. It demonstrated bilateral hydroureteronephrosis with 3 cm bladder calculus with bladder wall thickening and an enlarged prostate. The EKG demonstrated tachycardia, RBBB, and AFSB.  The hospitalists were consulted to admit the patient for further evaluation and treatment. The patient has received IV rocephin in the ED. Urology has been consulted.  Hospital Course  68 year old with history of CAD, history of MI, diabetes mellitus type 2, essential hypertension came to the hospital with complains of urinary burning and hematuria.  On CT of the abdomen was noted to have bilateral hydroureteronephrosis with renal stone and enlarged prostate.  He was seen by urology Dr. Mena Goes, status post cystoscopy, cystoscopy,cystolitholopaxy with holmium laser 3.3 cm stone, transurethral resection of prostate, bilateral retrograde pyelogram, bilateral ureteral stent placement, and his gross hematuria anticoagulation has been held, requiring CBI postoperatively, his urine culture growing MSSA,, Foley catheter discontinued 11/10/2018, no further hematuria, appears to be improving.   Bilateral hydronephrosis secondary to  bladder stone and gross hematuria, persistent Benign prostatic hyperplasia -Ultrasound showed continued severe hydronephrosis and with retrograde showed obstruction with thickened bladder wall, . - status post cystoscopy, cystoscopy,cystolitholopaxy with holmium laser 3.3 cm stone, transurethral resection of prostate, bilateral retrograde pyelogram, bilateral ureteral stent placement by Dr. Mena Goes 11/08/2018. -[place] to remove stent in 2 to 3 weeks in his office, -Catheter was discontinued, but unfortunately he did have urinary retention, was reinstructed today with a 1.4 red urine drained right away, as discussed with urology, he will be discharged with Foley catheter, to follow with him next week regarding voiding trial in the office.  Complicated MSSA urinary tract infection -Received total of 9 days of antibiotics during hospital stay, to be discharged on another 5 days of oral Keflex for total of 14 days treatment  Gross hematuria - CBI after surgery, hematuria has resolved, will monitor closely now he is back on Xarelto  BPH -Foley in place, Flomax and finasteride continued.  Status post TURP November 08, 2018, failed voiding trial, Foley catheter inserted before discharge  Acute blood loss anemia, normocytic - Baseline hemoglobin around 12.0.    Wrist 10.8 on discharge  History of coronary artery disease -Currently patient is chest pain-free.  Stop ASA for now as well.   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 Schedule an appointment as  soon as possible for a visit in 1 week(s).   Specialty:  Urology Why:  Appointment to be made by their service.  Contact information: [address] [place] [address] [phone]        [facility]. Schedule an appointment as soon as possible for a visit in 1 week(s).   Contact information: [address] [place] [address] [phone]            Discharge Instructions  and  Discharge Medications    Discharge Instructions    Discharge instructions   Complete by:  As directed    Follow with Primary MD  in 7 days   Get CBC, CMP,  checked  by Primary MD next visit.    Activity: As tolerated with Full fall precautions use walker/cane & assistance as needed   Disposition Home    Diet: Heart Healthy , CARB MODIFIED , with feeding assistance and aspiration precautions.  For Heart failure patients - Check your Weight same time everyday, if  you gain over 2 pounds, or you develop in leg swelling, experience more shortness of breath or chest pain, call your Primary MD immediately. Follow Cardiac Low Salt Diet and 1.5 lit/day fluid restriction.   On your next visit with your primary care physician please Get Medicines reviewed and adjusted.   Please request your Prim.MD to go over all Hospital Tests and Procedure/Radiological results at the follow up, please get all Hospital records sent to your Prim MD by signing hospital release before you go home.   If you experience worsening of your admission symptoms, develop shortness of breath, life threatening emergency, suicidal or homicidal thoughts you must seek medical attention immediately by calling 911 or calling your MD immediately  if symptoms less severe.  You Must read complete instructions/literature along with all the possible adverse reactions/side effects for all the Medicines you take and that have been prescribed to you. Take any new Medicines after you have  completely understood and accpet all the possible adverse reactions/side effects.   Do not drive, operating heavy machinery, perform activities at heights, swimming or participation in water activities or provide baby sitting services if your were admitted for syncope or siezures until you have seen by Primary MD or a Neurologist and advised to do so again.  Do not drive when taking Pain medications.    Do not take more than prescribed Pain, Sleep and Anxiety Medications  Special Instructions: If you have smoked or chewed Tobacco  in the last 2 yrs please stop smoking, stop any regular Alcohol  and or any Recreational drug use.  Wear Seat belts while driving.   Please note  You were cared for by a hospitalist during your hospital stay. If you have any questions about your discharge medications or the care you received while you were in the hospital after you are discharged, you can call the unit and asked to speak with the hospitalist on call if the hospitalist that took care of you is not available. Once you are discharged, your primary care physician will handle any further medical issues. Please note that NO REFILLS for any discharge medications will be authorized once you are discharged, as it is imperative that you return to your primary care physician (or establish a relationship with a primary care physician if you do not have one) for your aftercare needs so that they can reassess your need for medications and monitor your lab values.   Increase activity slowly   Complete by:  As directed      Allergies as of 11/11/2018   No Known Allergies     Medication List    STOP taking these medications   aspirin 81 MG chewable tablet   ibuprofen 200 MG tablet Commonly known as:  ADVIL,MOTRIN   Rivaroxaban 15 & 20 MG Tbpk Replaced by:  rivaroxaban 20 MG Tabs tablet     TAKE these medications   acetaminophen 325 MG tablet Commonly known as:  TYLENOL Take 2 tablets (650 mg total) by  mouth every 6 (six) hours as needed for mild pain (or Fever >/= 101).   cephALEXin 500 MG capsule Commonly known as:  KEFLEX Take 1 capsule (500 mg total) by mouth every 8 (eight) hours.   cyclobenzaprine 10 MG tablet Commonly known as:  FLEXERIL Take 1 tablet (10 mg total) by mouth 3 (three) times daily as needed for muscle spasms.   finasteride 5 MG tablet Commonly known as:  PROSCAR Take 1 tablet (5 mg total) by  mouth daily for 30 days.   metFORMIN 1000 MG tablet Commonly known as:  GLUCOPHAGE Take 1 tablet (1,000 mg total) by mouth 2 (two) times daily with a meal. What changed:    medication strength  how much to take   multivitamin with minerals Tabs tablet Take 1 tablet by mouth daily.   pentosan polysulfate 100 MG capsule Commonly known as:  ELMIRON Take 1 capsule (100 mg total) by mouth 3 (three) times daily for 7 days.   rivaroxaban 20 MG Tabs tablet Commonly known as:  XARELTO Take 1 tablet (20 mg total) by mouth daily with breakfast. Start taking on:  November 12, 2018 Replaces:  Rivaroxaban 15 & 20 MG Tbpk   tamsulosin 0.4 MG Caps capsule Commonly known as:  FLOMAX Take 1 capsule (0.4 mg total) by mouth daily after supper for 30 days.   traMADol 50 MG tablet Commonly known as:  ULTRAM Take 1 tablet (50 mg total) by mouth every 6 (six) hours as needed.         Diet and Activity recommendation: See Discharge Instructions above   Consults obtained -  Urology   Major procedures and Radiology Reports - PLEASE review detailed and final reports for all details, in brief -   Procedure: Cystoscopy, cystolitholopaxy with holmium laser 3.3 cm stone, transurethral resection of prostate, bilateral retrograde pyelogram, bilateral ureteral stent placement  Surgeon: Mena Goes on 11/08/2018   US Renal  Result Date: 11/08/2018 CLINICAL DATA:  Hydronephrosis. EXAM: RENAL / URINARY TRACT ULTRASOUND COMPLETE COMPARISON:  CT scan of November 03, 2018. FINDINGS: Right  Kidney: Renal measurements: 14.2 x 7.4 x 7.0 cm = volume: 384 mL . Echogenicity within normal limits. Severe hydronephrosis is noted. No mass visualized. Left Kidney: Renal measurements: 13.6 x 6.9 x 5.8 cm = volume: 284 mL. Echogenicity within normal limits. Severe hydronephrosis is noted. No mass visualized. Bladder: Diffuse bladder wall thickening is noted. Large bladder calculus is noted. Foley catheter is noted. IMPRESSION: Severe bilateral hydronephrosis is noted. Large bladder calculus is noted with Foley catheter. Diffuse bladder wall thickening is noted and cystitis can not be excluded. Electronically Signed   By: Lupita Raider, M.D.   On: 11/08/2018 12:10   Ct Renal Stone Study  Result Date: 11/03/2018 CLINICAL DATA:  Hematuria EXAM: CT ABDOMEN AND PELVIS WITHOUT CONTRAST TECHNIQUE: Multidetector CT imaging of the abdomen and pelvis was performed following the standard protocol without IV contrast. COMPARISON:  None. FINDINGS: LOWER CHEST: There is no basilar pleural or apical pericardial effusion. HEPATOBILIARY: The hepatic contours and density are normal. There is no intra- or extrahepatic biliary dilatation. The gallbladder is normal. PANCREAS: The pancreatic parenchymal contours are normal and there is no ductal dilatation. There is no peripancreatic fluid collection. SPLEEN: Normal. ADRENALS/URINARY TRACT: --Adrenal glands: Normal. --Right kidney/ureter: There is severe hydroureteronephrosis with mild perinephric stranding. At the right ureteral orifice, there is a large bladder calculus that measures up to 30 mm. --Left kidney/ureter: There is severe left hydroureteronephrosis with mild periureteral and perinephric stranding. No ureteral stone. --Urinary bladder: 30 mm calculus within the urinary bladder near the right ureteral orifice. There is diffuse bladder wall thickening. STOMACH/BOWEL: --Stomach/Duodenum: There is no hiatal hernia or other gastric abnormality. The duodenal course and  caliber are normal. --Small bowel: No dilatation or inflammation. --Colon: No focal abnormality. --Appendix: Normal. VASCULAR/LYMPHATIC: Normal course and caliber of the major abdominal vessels. No abdominal or pelvic lymphadenopathy. REPRODUCTIVE: Enlarged prostate measures 7.3 cm in transverse dimension. MUSCULOSKELETAL. No bony  spinal canal stenosis or focal osseous abnormality. OTHER: None. IMPRESSION: 1. Severe bilateral hydroureteronephrosis with 3 cm bladder calculus near the right ureteral orifice. No ureterolithiasis. 2. 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 Electronically Signed   By: Deatra Robinson M.D.   On: 11/03/2018 05:04   Vas [place] Lower Extremity Venous (dvt)  Result Date: 11/07/2018  Lower Venous Study Indications: History of DVT.  Performing Technologist: Chanda Busing RVT  Examination Guidelines: A complete evaluation includes B-mode imaging, spectral Doppler, color Doppler, and power Doppler as needed of all accessible portions of each vessel. Bilateral testing is considered an integral part of a complete examination. Limited examinations for reoccurring indications may be performed as noted.  Right Venous Findings: +---------+---------------+---------+-----------+----------+-----------------+            Compressibility Phasicity Spontaneity Properties Summary            +---------+---------------+---------+-----------+----------+-----------------+  CFV       Full            Yes       Yes                                       +---------+---------------+---------+-----------+----------+-----------------+  SFJ       Full                                                                +---------+---------------+---------+-----------+----------+-----------------+  FV Prox   Partial                                          Age Indeterminate  +---------+---------------+---------+-----------+----------+-----------------+  FV Mid     Partial                                          Age Indeterminate  +---------+---------------+---------+-----------+----------+-----------------+  FV Distal Partial         Yes       Yes                    Age Indeterminate  +---------+---------------+---------+-----------+----------+-----------------+  PFV       Full                                                                +---------+---------------+---------+-----------+----------+-----------------+  POP       Partial         Yes       Yes                    Acute              +---------+---------------+---------+-----------+----------+-----------------+  PTV       Full                                                                +---------+---------------+---------+-----------+----------+-----------------+  PERO      Full                                                                +---------+---------------+---------+-----------+----------+-----------------+  Left Venous Findings: +---------+---------------+---------+-----------+----------+-------+            Compressibility Phasicity Spontaneity Properties Summary  +---------+---------------+---------+-----------+----------+-------+  CFV       Full            Yes       Yes                             +---------+---------------+---------+-----------+----------+-------+  SFJ       Full                                                      +---------+---------------+---------+-----------+----------+-------+  FV Prox   Full                                                      +---------+---------------+---------+-----------+----------+-------+  FV Mid    Full                                                      +---------+---------------+---------+-----------+----------+-------+  FV Distal Full                                                      +---------+---------------+---------+-----------+----------+-------+  PFV       Full                                                       +---------+---------------+---------+-----------+----------+-------+  POP       Full            Yes       Yes                             +---------+---------------+---------+-----------+----------+-------+  PTV       Full                                                      +---------+---------------+---------+-----------+----------+-------+  PERO      Full                                                      +---------+---------------+---------+-----------+----------+-------+  Summary: Right: Findings consistent with acute deep vein thrombosis involving the right popliteal vein. Findings consistent with age indeterminate deep vein thrombosis involving the right femoral vein. No cystic structure found in the popliteal fossa. Left: There is no evidence of deep vein thrombosis in the lower extremity. No cystic structure found in the popliteal fossa.  *See table(s) above for measurements and observations. Electronically signed by Fabienne Bruns MD on 11/07/2018 at 5:01:23 PM.    Final     Micro Results     Recent Results (from the past 240 hour(s))  Urine Culture     Status: Abnormal   Collection Time: 11/03/18  2:48 AM  Result Value Ref Range Status   Specimen Description   Final    URINE, CLEAN CATCH Performed at [facility], [address], [place], [place] [address]    Special Requests   Final    NONE Performed at [facility], [address] [place], [place] [address]    Culture >=100,000 COLONIES/mL STAPHYLOCOCCUS AUREUS (A)  Final   Report Status 11/05/2018 FINAL  Final   Organism ID, Bacteria STAPHYLOCOCCUS AUREUS (A)  Final      Susceptibility   Staphylococcus aureus - MIC*    CIPROFLOXACIN 2 INTERMEDIATE Intermediate     GENTAMICIN <=0.5 SENSITIVE Sensitive     NITROFURANTOIN <=16 SENSITIVE Sensitive     OXACILLIN <=0.25 SENSITIVE Sensitive     TETRACYCLINE <=1 SENSITIVE Sensitive     VANCOMYCIN <=0.5 SENSITIVE Sensitive     TRIMETH/SULFA  <=10 SENSITIVE Sensitive     CLINDAMYCIN <=0.25 SENSITIVE Sensitive     RIFAMPIN <=0.5 SENSITIVE Sensitive     Inducible Clindamycin NEGATIVE Sensitive     * >=100,000 COLONIES/mL STAPHYLOCOCCUS AUREUS  Culture, blood (Routine X 2) w Reflex to ID Panel     Status: None   Collection Time: 11/03/18  6:29 AM  Result Value Ref Range Status   Specimen Description   Final    BLOOD RIGHT ANTECUBITAL Performed at [facility], [address] [place], [place] [address]    Special Requests   Final    BOTTLES DRAWN AEROBIC AND ANAEROBIC Blood Culture adequate volume Performed at [facility], [address] [place], [place] [address]    Culture   Final    NO GROWTH 5 DAYS Performed at [facility], [address], [place], [place] [address]    Report Status 11/08/2018 FINAL  Final  Culture, blood (Routine X 2) w Reflex to ID Panel     Status: None   Collection Time: 11/03/18  6:29 AM  Result Value Ref Range Status   Specimen Description   Final    BLOOD LEFT ANTECUBITAL Performed at [facility], [address], [place], [place] [address]    Special Requests   Final    BOTTLES DRAWN AEROBIC AND ANAEROBIC Blood Culture adequate volume Performed at [facility], [address], [place], [place] [address]    Culture   Final    NO GROWTH 5 DAYS Performed at [facility], [address], [place], [place] [address]    Report Status 11/08/2018 FINAL  Final  Surgical pcr screen     Status: Abnormal   Collection Time: 11/08/18 12:50 PM  Result Value Ref Range Status   MRSA, PCR NEGATIVE NEGATIVE Final   Staphylococcus aureus POSITIVE (A) NEGATIVE Final    Comment: (NOTE) The Xpert SA Assay (FDA  approved for NASAL specimens in patients 78 years of age and older), is one component of a comprehensive surveillance program. It is not intended to diagnose infection nor to guide or monitor  treatment. Performed at [facility], [address], [place], [place] [address]        Today   Subjective:   Timothy Walsh today has no headache,no chest abdominal pain,no new weakness tingling or numbness, feels much better wants to go home today.  Some urinary retention earlier today, had Foley catheter inserted with 1.4 L output  Objective:   Blood pressure (!) 151/91, pulse 91, temperature 97.7 F (36.5 C), temperature source Oral, resp. rate 18, height  (1.753 m), weight 92.9 kg, SpO2 93 %.   Intake/Output Summary (Last 24 hours) at 11/11/2018 1358 Last data filed at 11/11/2018 0900 Gross per 24 hour  Intake 480 ml  Output 1600 ml  Net -1120 ml    Exam Awake Alert, Oriented x 3, No new F.N deficits, Normal affect Symmetrical Chest wall movement, Good air movement bilaterally, CTAB RRR,No Gallops,Rubs or new Murmurs, No Parasternal Heave +ve B.Sounds, Abd Soft, Non tender, No organomegaly appriciated, No rebound -guarding or rigidity. No Cyanosis, Clubbing or edema, No new Rash or bruise  Data Review   CBC w Diff:  Lab Results  Component Value Date   WBC 11.2 (H) 11/11/2018   HGB 10.8 (L) 11/11/2018   HCT 35.3 (L) 11/11/2018   PLT 317 11/11/2018   LYMPHOPCT 5 11/03/2018   MONOPCT 11 11/03/2018   EOSPCT 0 11/03/2018   BASOPCT 0 11/03/2018    CMP:  Lab Results  Component Value Date   NA 136 11/11/2018   K 3.8 11/11/2018   CL 101 11/11/2018   CO2 25 11/11/2018   BUN 20 11/11/2018   CREATININE 1.25 (H) 11/11/2018   PROT 7.6 11/21/2017   ALBUMIN 3.5 11/21/2017   BILITOT 0.4 11/21/2017   ALKPHOS 81 11/21/2017   AST 14 (L) 11/21/2017   ALT 16 (L) 11/21/2017  .   Total Time in preparing paper work, data evaluation and todays exam - 35 minutes  Huey Bienenstock M.D on 11/11/2018 at 1:58 PM  [facility]   Office  [phone]

## 2018-11-29 ENCOUNTER — Encounter (HOSPITAL_COMMUNITY): Payer: Self-pay | Admitting: Family Medicine

## 2018-11-29 ENCOUNTER — Emergency Department (HOSPITAL_COMMUNITY)
Admission: EM | Admit: 2018-11-29 | Discharge: 2018-11-30 | Disposition: A | Discharge status: Home / Self Care | Payer: Self-pay | Attending: Emergency Medicine | Admitting: Emergency Medicine

## 2018-11-29 DIAGNOSIS — E119 Type 2 diabetes mellitus without complications: Secondary | ICD-10-CM | POA: Insufficient documentation

## 2018-11-29 DIAGNOSIS — Z7901 Long term (current) use of anticoagulants: Secondary | ICD-10-CM | POA: Insufficient documentation

## 2018-11-29 DIAGNOSIS — Z79899 Other long term (current) drug therapy: Secondary | ICD-10-CM | POA: Insufficient documentation

## 2018-11-29 DIAGNOSIS — I2510 Atherosclerotic heart disease of native coronary artery without angina pectoris: Secondary | ICD-10-CM | POA: Insufficient documentation

## 2018-11-29 DIAGNOSIS — Z7984 Long term (current) use of oral hypoglycemic drugs: Secondary | ICD-10-CM | POA: Insufficient documentation

## 2018-11-29 DIAGNOSIS — I10 Essential (primary) hypertension: Secondary | ICD-10-CM | POA: Insufficient documentation

## 2018-11-29 DIAGNOSIS — R339 Retention of urine, unspecified: Secondary | ICD-10-CM | POA: Insufficient documentation

## 2018-11-29 LAB — URINALYSIS, ROUTINE W REFLEX MICROSCOPIC
Bacteria, UA: NONE SEEN
Bilirubin Urine: NEGATIVE
GLUCOSE, UA: 150 mg/dL — AB
Ketones, ur: 5 mg/dL — AB
Leukocytes,Ua: NEGATIVE
Nitrite: NEGATIVE
PROTEIN: 100 mg/dL — AB
RBC / HPF: >50 RBC/hpf — ABNORMAL HIGH (ref 0–5)
Specific Gravity, Urine: 1.017 (ref 1.005–1.030)
pH: 6 (ref 5.0–8.0)

## 2018-11-29 NOTE — Discharge Instructions (Signed)
Finish your keflex that you have already. Follow-up with urology later this week to have stents and foley removed. Return here for any new/acute changes.

## 2018-11-29 NOTE — ED Triage Notes (Signed)
Patient is from a hotel, where he resides, and transported via [facility]. Patient has kidney stents in place and where suppose to be removed today. However, patient was feeling too bad. Patient is experiencing urinary retention for the last 2 hours. Also, experiencing hematuria and dysuria. Patient describes pain as intermittent burning.   Spouse: Jesslynn Kruck [phone]

## 2018-11-29 NOTE — ED Notes (Signed)
Bladder scan performed and two readings were obtained. Highest result was >437mL. RN [place] notified of same.

## 2018-11-29 NOTE — ED Notes (Signed)
PA at bedside.

## 2018-11-29 NOTE — ED Provider Notes (Addendum)
[facility]-EMERGENCY DEPT Provider Note   CSN: [id number] Arrival date & time: 11/29/18  2205    History   Chief Complaint Chief Complaint  Patient presents with  . Urinary Retention  . Nephrolithiasis    HPI Timothy Walsh is a 68 y.o. male.     The history is provided by the patient and medical records.     68 y.o. M with hx of DM, HTN, prior MI, recent TURP and ureteral stents and foley catheter in place, presenting to the ED for urinary retention.  Patient reports he has not had any urine output from his foley in several hours.  States he has been drinking a ton of fluids but nothing seems to be coming out.  Foley has been in for >1 week and no issues until now.  No fevers, chills, sweats.  Denies flank pain, nausea, vomiting, etc. States he was due to have his stents removed today but felt bad-- apparently he was having diarrhea after eating [place] food last night.  He did call the clinic and will be rescheduled for later this week.  Past Medical History:  Diagnosis Date  . Diabetes mellitus without complication (HCC)   . Hypertension   . MI, old     Patient Active Problem List   Diagnosis Date Noted  . Complicated UTI (urinary tract infection) 11/03/2018  . Sepsis due to urinary tract infection (HCC) 11/03/2018  . Calculus of urinary bladder 11/03/2018  . Bilateral hydronephrosis 11/03/2018  . DKA (diabetic ketoacidoses) (HCC) 11/03/2018  . DM II (diabetes mellitus, type II), controlled (HCC) 11/03/2018  . Hematuria 11/03/2018  . Chronic anticoagulation 11/03/2018  . CAD (coronary artery disease) 11/03/2018  . Essential hypertension 11/03/2018    Past Surgical History:  Procedure Laterality Date  . CYSTOSCOPY WITH LITHOLAPAXY N/A 11/08/2018   Procedure: TRANSURETHRAL RESECTION OF PROSTATE, BILATERAL URETERAL STENTS, CYSTOSCOPY WITH HOLMIUM LASER  LITHOLAPAXY 3.3CM, BILATERAL RETROGRADE PYELOGRAM;  Surgeon: [name], MD;  Location:  [facility];  Service: Urology;  Laterality: N/A;  . HERNIA REPAIR          Home Medications    Prior to Admission medications   Medication Sig Start Date End Date Taking? Authorizing Provider  acetaminophen (TYLENOL) 325 MG tablet Take 2 tablets (650 mg total) by mouth every 6 (six) hours as needed for mild pain (or Fever >/= 101). 11/11/18   Elgergawy, Leana Roe, MD  cephALEXin (KEFLEX) 500 MG capsule Take 1 capsule (500 mg total) by mouth every 8 (eight) hours. 11/11/18   Elgergawy, Leana Roe, MD  cyclobenzaprine (FLEXERIL) 10 MG tablet Take 1 tablet (10 mg total) by mouth 3 (three) times daily as needed for muscle spasms. Patient not taking: Reported on 11/03/2018 09/13/15   Raeford Razor, MD  finasteride (PROSCAR) 5 MG tablet Take 1 tablet (5 mg total) by mouth daily for 30 days. 11/06/18 12/06/18  Dimple Nanas, MD  metFORMIN (GLUCOPHAGE) 1000 MG tablet Take 1 tablet (1,000 mg total) by mouth 2 (two) times daily with a meal. 11/11/18   Elgergawy, Leana Roe, MD  Multiple Vitamin (MULTIVITAMIN WITH MINERALS) TABS tablet Take 1 tablet by mouth daily.    [provider]  rivaroxaban (XARELTO) 20 MG TABS tablet Take 1 tablet (20 mg total) by mouth daily with breakfast. 11/12/18   Elgergawy, Leana Roe, MD  tamsulosin (FLOMAX) 0.4 MG CAPS capsule Take 1 capsule (0.4 mg total) by mouth daily after supper for 30 days. 11/06/18 12/06/18  Amin, Ankit  Chirag, MD  traMADol (ULTRAM) 50 MG tablet Take 1 tablet (50 mg total) by mouth every 6 (six) hours as needed. Patient not taking: Reported on 11/22/2017 09/13/15   Raeford RazorKohut, Stephen, MD    Family History History reviewed. No pertinent family history.  Social History Social History   Tobacco Use  . Smoking status: Never Smoker  . Smokeless tobacco: Never Used  Substance Use Topics  . Alcohol use: Not Currently    Frequency: Never  . Drug use: Not Currently     Allergies   Patient has no known allergies.   Review of Systems Review of Systems   Genitourinary: Positive for decreased urine volume and difficulty urinating.  All other systems reviewed and are negative.    Physical Exam Updated Vital Signs BP (!) 141/88 (BP Location: Left Arm)   Pulse 100   Resp 19   Ht 5\' 9"  (1.753 m)   Wt 42 kg   SpO2 93%   BMI 13.67 kg/m   Physical Exam Vitals signs and nursing note reviewed.  Constitutional:      Appearance: He is well-developed.     Comments: Appears uncomfortable  HENT:     Head: Normocephalic and atraumatic.  Eyes:     Conjunctiva/sclera: Conjunctivae normal.     Pupils: Pupils are equal, round, and reactive to light.  Neck:     Musculoskeletal: Normal range of motion.  Cardiovascular:     Rate and Rhythm: Normal rate and regular rhythm.     Heart sounds: Normal heart sounds.  Pulmonary:     Effort: Pulmonary effort is normal.     Breath sounds: Normal breath sounds.  Abdominal:     General: Bowel sounds are normal.     Palpations: Abdomen is soft.     Comments: Bladder distention noted  Genitourinary:    Comments: Foley catheter in place, no urine in collection bag Musculoskeletal: Normal range of motion.  Skin:    General: Skin is warm and dry.  Neurological:     Mental Status: He is alert and oriented to person, place, and time.      ED Treatments / Results  Labs (all labs ordered are listed, but only abnormal results are displayed) Labs Reviewed  URINALYSIS, ROUTINE W REFLEX MICROSCOPIC - Abnormal; Notable for the following components:      Result Value   Color, Urine STRAW (*)    APPearance TURBID (*)    Glucose, UA 150 (*)    Hgb urine dipstick MODERATE (*)    Ketones, ur 5 (*)    Protein, ur 100 (*)    RBC / HPF >50 (*)    WBC, UA >50 (*)    All other components within normal limits  URINE CULTURE    EKG None  Radiology No results found.  Procedures Procedures (including critical care time)  Medications Ordered in ED Medications - No data to display   Initial  Impression / Assessment and Plan / ED Course  I have reviewed the triage vital signs and the nursing notes.  Pertinent labs & imaging results that were available during my care of the patient were reviewed by me and considered in my medical decision making (see chart for details).  68 y.o. M here with urinary retention.  Recent admission with hematuria, subsequently underwent TURP and bilateral ureteral stent placement, currently with Foley catheter in place.  States it has not drained in several hours despite drinking lots of fluids.  Denies fever, chills, sweats,  vomiting, etc.  Here patient appears very uncomfortable, distention over the bladder.  Bladder scan with >400 cc retained.  Foley catheter was exchanged with >500cc urine returned.  UA without bacteria noted.  Culture pending.  He is still on keflex, states another few days of abx left.  Will place new leg bag and leave Foley in place for now.  He can follow-up in clinic later in the week for stent Foley catheter removal.  He will return here for any new or acute changes.  Final Clinical Impressions(s) / ED Diagnoses   Final diagnoses:  Urinary retention    ED Discharge Orders    None       Garlon Hatchet, PA-C 11/29/18 2329    Garlon Hatchet, PA-C 11/30/18 [id number]    Derwood Kaplan, MD 11/30/18 [phone]

## 2018-12-01 LAB — URINE CULTURE: Culture: >=100000 — AB

## 2018-12-02 ENCOUNTER — Telehealth: Payer: Self-pay | Admitting: *Deleted

## 2018-12-02 NOTE — Telephone Encounter (Signed)
Post ED Visit - Positive Culture Follow-up: Unsuccessful Patient Follow-up  Culture assessed and recommendations reviewed by:  []  Enzo Bi, Pharm.D. []  Celedonio Miyamoto, Pharm.D., BCPS AQ-ID []  Garvin Fila, Pharm.D., BCPS []  Georgina Pillion, Pharm.D., BCPS []  [place], [place].D., BCPS, AAHIVP []  Estella Husk, Pharm.D., BCPS, AAHIVP []  Sherlynn Carbon, PharmD []  Pollyann Samples, PharmD, BCPS  Positive urine culture, reviewed by Ebbie Ridge, PA-C  []  Patient discharged without antimicrobial prescription and treatment is now indicated [x]  Organism is resistant to prescribed ED discharge antimicrobial []  Patient with positive blood cultures  Recommend Diflucan 200mg  PO QD x 2 weeks  Unable to contact patient after 3 attempts, letter will be sent to address on file  Lysle Pearl 12/02/2018, 12:08 PM

## 2018-12-02 NOTE — Progress Notes (Signed)
ED Antimicrobial Stewardship Positive Culture Follow Up   Timothy Walsh is an 68 y.o. male who presented to [facility] on 11/29/2018 with a chief complaint of  Chief Complaint  Patient presents with  . Urinary Retention  . Nephrolithiasis    Recent Results (from the past 720 hour(s))  Urine Culture     Status: Abnormal   Collection Time: 11/03/18  2:48 AM  Result Value Ref Range Status   Specimen Description   Final    URINE, CLEAN CATCH Performed at [facility], [address], [place], [place] [address]    Special Requests   Final    NONE Performed at [facility], [address], [place], [place] [address]    Culture >=100,000 COLONIES/mL STAPHYLOCOCCUS AUREUS (A)  Final   Report Status 11/05/2018 FINAL  Final   Organism ID, Bacteria STAPHYLOCOCCUS AUREUS (A)  Final      Susceptibility   Staphylococcus aureus - MIC*    CIPROFLOXACIN 2 INTERMEDIATE Intermediate     GENTAMICIN <=0.5 SENSITIVE Sensitive     NITROFURANTOIN <=16 SENSITIVE Sensitive     OXACILLIN <=0.25 SENSITIVE Sensitive     TETRACYCLINE <=1 SENSITIVE Sensitive     VANCOMYCIN <=0.5 SENSITIVE Sensitive     TRIMETH/SULFA <=10 SENSITIVE Sensitive     CLINDAMYCIN <=0.25 SENSITIVE Sensitive     RIFAMPIN <=0.5 SENSITIVE Sensitive     Inducible Clindamycin NEGATIVE Sensitive     * >=100,000 COLONIES/mL STAPHYLOCOCCUS AUREUS  Culture, blood (Routine X 2) w Reflex to ID Panel     Status: None   Collection Time: 11/03/18  6:29 AM  Result Value Ref Range Status   Specimen Description   Final    BLOOD RIGHT ANTECUBITAL Performed at [facility], [address] [place], [place] [address]    Special Requests   Final    BOTTLES DRAWN AEROBIC AND ANAEROBIC Blood Culture adequate volume Performed at [facility], [address] [place], [place] [address]    Culture   Final    NO GROWTH 5 DAYS Performed at [facility], [address], [place], [place] [address]    Report Status 11/08/2018 FINAL  Final  Culture, blood (Routine X 2) w Reflex to ID Panel     Status: None   Collection Time: 11/03/18  6:29 AM  Result Value Ref Range Status   Specimen Description   Final    BLOOD LEFT ANTECUBITAL Performed at [facility], [address] [place], [place] [address]    Special Requests   Final    BOTTLES DRAWN AEROBIC AND ANAEROBIC Blood Culture adequate volume Performed at [facility], [address], [place], [place] [address]    Culture   Final    NO GROWTH 5 DAYS Performed at [facility], [address] [place], [place] [address]    Report Status 11/08/2018 FINAL  Final  Surgical pcr screen     Status: Abnormal   Collection Time: 11/08/18 12:50 PM  Result Value Ref Range Status   MRSA, PCR NEGATIVE NEGATIVE Final   Staphylococcus aureus POSITIVE (A) NEGATIVE Final    Comment: (NOTE) The Xpert SA Assay (FDA approved for NASAL specimens in patients 39 years of age and older), is one component of a comprehensive surveillance program. It is not intended to diagnose infection nor to guide or monitor treatment. Performed at [facility], [address] [place],  [place] [address]   Urine culture     Status: Abnormal   Collection Time: 11/29/18 10:46 PM  Result Value Ref Range Status   Specimen Description   Final    URINE, RANDOM Performed at [facility], [address] [place], [place] [address]    Special Requests   Final    NONE Performed at [facility], [address] [place], [place] [address]    Culture (A)  Final    >=100,000 COLONIES/mL GROUP B STREP(S.AGALACTIAE)ISOLATED TESTING AGAINST S. AGALACTIAE NOT ROUTINELY PERFORMED DUE TO PREDICTABILITY OF AMP/PEN/VAN SUSCEPTIBILITY. >=100,000 COLONIES/mL YEAST    Report Status 12/01/2018 FINAL  Final    [x]  Treated with doxycycline, []   New  antibiotic prescription: fluconazole 200 mg PO daily x 2 weeks  ED Provider: Ebbie Ridge, Drucie Opitz T 12/02/2018, 11:46 AM Clinical Pharmacist [phone]

## 2018-12-27 ENCOUNTER — Emergency Department (HOSPITAL_COMMUNITY)
Admission: EM | Admit: 2018-12-27 | Discharge: 2018-12-30 | Disposition: E | Payer: Self-pay | Attending: Emergency Medicine | Admitting: Emergency Medicine

## 2018-12-27 ENCOUNTER — Emergency Department (HOSPITAL_COMMUNITY): Payer: Self-pay

## 2018-12-27 ENCOUNTER — Encounter (HOSPITAL_COMMUNITY): Payer: Self-pay | Admitting: Emergency Medicine

## 2018-12-27 DIAGNOSIS — Z79899 Other long term (current) drug therapy: Secondary | ICD-10-CM | POA: Insufficient documentation

## 2018-12-27 DIAGNOSIS — I469 Cardiac arrest, cause unspecified: Secondary | ICD-10-CM | POA: Insufficient documentation

## 2018-12-27 DIAGNOSIS — I10 Essential (primary) hypertension: Secondary | ICD-10-CM | POA: Insufficient documentation

## 2018-12-27 DIAGNOSIS — E119 Type 2 diabetes mellitus without complications: Secondary | ICD-10-CM | POA: Insufficient documentation

## 2018-12-27 DIAGNOSIS — I2510 Atherosclerotic heart disease of native coronary artery without angina pectoris: Secondary | ICD-10-CM | POA: Insufficient documentation

## 2018-12-27 DIAGNOSIS — Z7984 Long term (current) use of oral hypoglycemic drugs: Secondary | ICD-10-CM | POA: Insufficient documentation

## 2018-12-27 MED ORDER — SODIUM BICARBONATE 8.4 % IV SOLN
INTRAVENOUS | Status: AC | PRN
  Administered 2018-12-27: 50 meq via INTRAVENOUS

## 2018-12-27 MED ORDER — ROCURONIUM BROMIDE 50 MG/5ML IV SOLN
INTRAVENOUS | Status: AC | PRN
  Administered 2018-12-27: 80 mg via INTRAVENOUS

## 2018-12-27 MED ORDER — ETOMIDATE 2 MG/ML IV SOLN
INTRAVENOUS | Status: AC | PRN
  Administered 2018-12-27: 20 mg via INTRAVENOUS

## 2018-12-27 MED ORDER — CALCIUM CHLORIDE 10 % IV SOLN
INTRAVENOUS | Status: AC | PRN
  Administered 2018-12-27: 1 g via INTRAVENOUS

## 2018-12-30 NOTE — Code Documentation (Signed)
Pt's heart was ultra-sounded by MD; no activity noted.

## 2018-12-30 NOTE — Code Documentation (Signed)
Patient time of death occurred at 0547. 

## 2018-12-30 NOTE — ED Notes (Signed)
Additional notes per EMS: pt was last seen normal approx 03:15 by wife. When she re-checked husband at 04:00 pt was not breathing and had no pulse. EMS received 911 call at 04:04 and arrived on scene at 04:19. ROSC obtained @ 04:49 - lost pulses again at 05:04 and pt was PEA. Total of (8) epi given, no shock administered

## 2018-12-30 NOTE — ED Provider Notes (Addendum)
[facility] EMER[facility]ENCY [facility] Provider Note   CSN: [id number] Arrival date & time: 11/25/18  [id number]    History   Chief Complaint Chief Complaint  Patient presents with  . Cardiac Arrest    HPI Vilinda BoehringerLarry Lavey is a 68 y.o. male.     The history is provided by the EMS personnel and the spouse. The history is limited by the condition of the patient.  Cardiac Arrest  Witnessed by:  Not witnessed (Seen 1 hour prior to arrest) Incident location:  Home Time since incident:  2 hours Time before BLS initiated:  > 5 minutes Time before ALS initiated:  8-10 minutes Condition upon EMS arrival:  Apneic Pulse:  Absent Initial cardiac rhythm per EMS:  Asystole Treatments prior to arrival:  ACLS protocol Medications given prior to ED:  Epinephrine Airway:  Bag valve mask Rhythm on admission to ED:  Sinus bradycardia Associated symptoms: no shortness of breath   Risk factors: no drug overdose   Per wife said patient had surgery for TURP on 11/08/2018 and had stents and has not been feeling well but could not get back to have them removed due to covid outbreak but had no complaints in the previous few days.    Past Medical History:  Diagnosis Date  . Diabetes mellitus without complication (HCC)   . Hypertension   . MI, old     Patient Active Problem List   Diagnosis Date Noted  . Complicated UTI (urinary tract infection) 11/03/2018  . Sepsis due to urinary tract infection (HCC) 11/03/2018  . Calculus of urinary bladder 11/03/2018  . Bilateral hydronephrosis 11/03/2018  . DKA (diabetic ketoacidoses) (HCC) 11/03/2018  . DM II (diabetes mellitus, type II), controlled (HCC) 11/03/2018  . Hematuria 11/03/2018  . Chronic anticoagulation 11/03/2018  . CAD (coronary artery disease) 11/03/2018  . Essential hypertension 11/03/2018    Past Surgical History:  Procedure Laterality Date  . CYSTOSCOPY WITH LITHOLAPAXY N/A 11/08/2018   Procedure: TRANSURETHRAL RESECTION OF  PROSTATE, BILATERAL URETERAL STENTS, CYSTOSCOPY WITH HOLMIUM LASER  LITHOLAPAXY 3.3CM, BILATERAL RETROGRADE PYELOGRAM;  Surgeon: Jerilee FieldEskridge, Matthew, MD;  Location: [facility];  Service: Urology;  Laterality: N/A;  . HERNIA REPAIR          Home Medications    Prior to Admission medications   Medication Sig Start Date End Date Taking? Authorizing Provider  acetaminophen (TYLENOL) 325 MG tablet Take 2 tablets (650 mg total) by mouth every 6 (six) hours as needed for mild pain (or Fever >/= 101). Patient not taking: Reported on 11/29/2018 11/11/18   Elgergawy, Leana Roeawood S, MD  cephALEXin (KEFLEX) 500 MG capsule Take 1 capsule (500 mg total) by mouth every 8 (eight) hours. Patient not taking: Reported on 11/29/2018 11/11/18   Elgergawy, Leana Roeawood S, MD  clotrimazole-betamethasone (LOTRISONE) cream Apply 1 application topically 2 (two) times daily. 11/22/18   [provider]  cyclobenzaprine (FLEXERIL) 10 MG tablet Take 1 tablet (10 mg total) by mouth 3 (three) times daily as needed for muscle spasms. Patient not taking: Reported on 11/29/2018 09/13/15   Raeford RazorKohut, Stephen, MD  doxycycline (VIBRAMYCIN) 100 MG capsule Take 100 mg by mouth 2 (two) times daily. 11/22/18   [provider]  metFORMIN (GLUCOPHAGE) 1000 MG tablet Take 1 tablet (1,000 mg total) by mouth 2 (two) times daily with a meal. Patient taking differently: Take 1,000 mg by mouth every evening.  11/11/18   Elgergawy, Leana Roeawood S, MD  Multiple Vitamin (MULTIVITAMIN WITH MINERALS) TABS tablet Take 1 tablet  by mouth daily.    [provider]  rivaroxaban (XARELTO) 20 MG TABS tablet Take 1 tablet (20 mg total) by mouth daily with breakfast. Patient taking differently: Take 20 mg by mouth every 12 (twelve) hours.  11/12/18   Elgergawy, Leana Roe, MD  traMADol (ULTRAM) 50 MG tablet Take 1 tablet (50 mg total) by mouth every 6 (six) hours as needed. Patient not taking: Reported on 11/29/2018 09/13/15   Raeford Razor, MD    Family History  No family history on file.  Social History Social History   Tobacco Use  . Smoking status: Never Smoker  . Smokeless tobacco: Never Used  Substance Use Topics  . Alcohol use: Not Currently    Frequency: Never  . Drug use: Not Currently     Allergies   Patient has no known allergies.   Review of Systems Review of Systems  Unable to perform ROS: Acuity of condition  Respiratory: Negative for shortness of breath.      Physical Exam Updated Vital Signs BP 103/79   Pulse (!) 48   Resp 20   Wt 80 kg   SpO2 (!) 33%   BMI 26.05 kg/m   Physical Exam Constitutional:      Appearance: He is normal weight.  HENT:     Head: Normocephalic and atraumatic.     Nose: Nose normal.     Mouth/Throat:     Mouth: Mucous membranes are moist.  Eyes:     Conjunctiva/sclera: Conjunctivae normal.  Cardiovascular:     Rate and Rhythm: Bradycardia present.     Pulses: Normal pulses.  Abdominal:     General: There is distension.     Hernia: No hernia is present.  Musculoskeletal:        General: No deformity.  Lymphadenopathy:     Cervical: No cervical adenopathy.  Skin:    General: Skin is dry.     Capillary Refill: Capillary refill takes more than 3 seconds.  Neurological:     GCS: GCS eye subscore is 1. GCS verbal subscore is 1. GCS motor subscore is 1.      ED Treatments / Results  Labs (all labs ordered are listed, but only abnormal results are displayed) Labs Reviewed - No data to display  EKG EKG Interpretation  Date/Time:  Tuesday 01/08/2019 05:48:09 EDT Ventricular Rate:  0 PR Interval:    QRS Duration:   QT Interval:    QTC Calculation:   R Axis:   0 Text Interpretation:  Uncertain rhythm: review No further analysis attempted - not enough leads could be measured Confirmed by Nicanor Alcon, Sahira Cataldi ([id number]) on Jan 08, 2019 6:11:20 AM   Radiology No results found.  Procedures Procedure Name: Intubation Date/Time: Jan 08, 2019 6:20 AM Performed by: Cy Blamer, MD Pre-anesthesia Checklist: Patient identified, Patient being monitored, Emergency Drugs available, Timeout performed and Suction available Oxygen Delivery Method: Ambu bag Preoxygenation: Pre-oxygenation with 100% oxygen Induction Type: Rapid sequence Ventilation: Mask ventilation without difficulty Laryngoscope Size: Glidescope and 4 Grade View: Grade II Tube size: 7.5 mm Number of attempts: 1 Placement Confirmation: ETT inserted through vocal cords under direct vision,  CO2 detector and Breath sounds checked- equal and bilateral Dental Injury: Teeth and Oropharynx as per pre-operative assessment       (including critical care time)  Medications Ordered in ED Medications  etomidate (AMIDATE) injection (20 mg Intravenous Given Jan 08, 2019 0538)  rocuronium (ZEMURON) injection (80 mg Intravenous Given 01/08/19 0538)  sodium bicarbonate injection (50 mEq Intravenous  Given 01/21/19 0544)  calcium chloride injection (1 g Intravenous Given Jan 21, 2019 0546)     Cased D/w ME Dr.  Candis Shine who will see patient   Wife called into ED, I had a lengthy discussion with her.  She expressed gratitude and was given opportunity to ask questions. She has no further questions at this time  Final Clinical Impressions(s) / ED Diagnoses   Final diagnoses:  Cardiac arrest (HCC)    630 ME called back to decline stating patient certificate had to go to Dr. Mena Goes  645 called and spoke with Dr. Liliane Shi who did not think Dr. Mena Goes would sign certificate but would pass along information  650 case D/w Mr. Candis Shine who will call his supervisor  709 Case d/w Mr. Candis Shine and the [facility] in [place] states that the surgeon or hospitalist must sign the certificate.    I have sent an Epic inbox email to Dr. Alden Hipp, Dayquan Buys, MD 2019-01-21 Lear Ng, Harly Pipkins, MD 2019-01-21 [phone]

## 2018-12-30 DEATH — deceased

## 2021-02-03 IMAGING — CT CT RENAL STONE PROTOCOL
2 of 4 series · 16 of 46 positions shown, 18 images · non-contrast
Comparison: None.

CLINICAL DATA: Hematuria

EXAM:
CT ABDOMEN AND PELVIS WITHOUT CONTRAST
TECHNIQUE: Multidetector CT imaging of the abdomen and pelvis was performed
following the standard protocol without IV contrast.

[Series 2: axial st · axial · 0.86mm/px · z∈[+1158,+1548]mm · 13 of 88 slices shown, 15 images]
[im 5/88  soft-tissue]
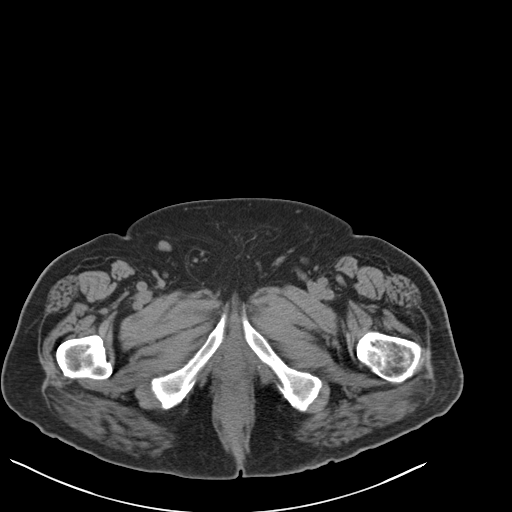
[im 5/88  bone]
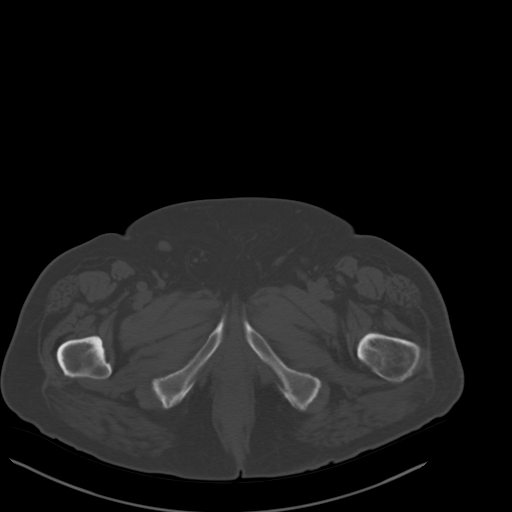
[im 14/88  soft-tissue]
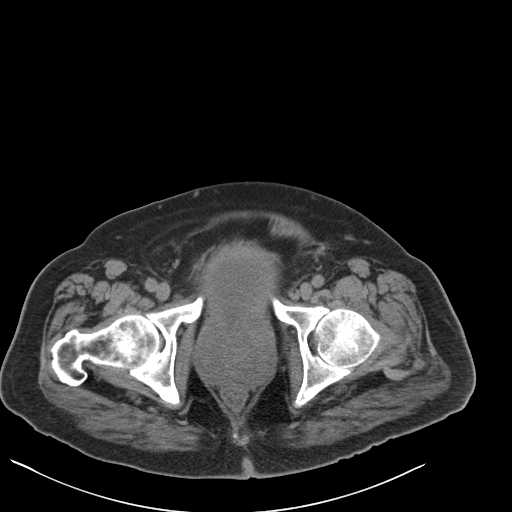
[im 18/88  soft-tissue]
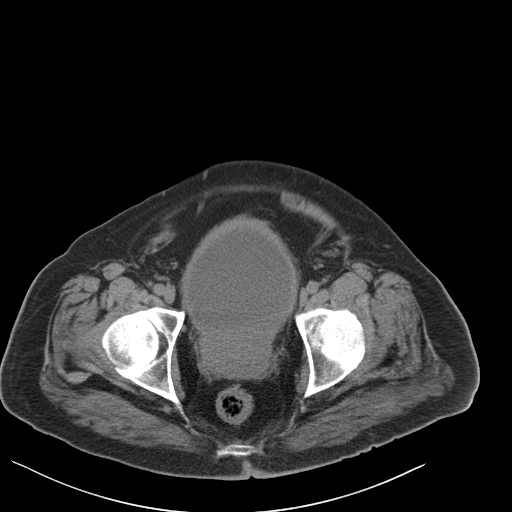
[im 27/88  soft-tissue]
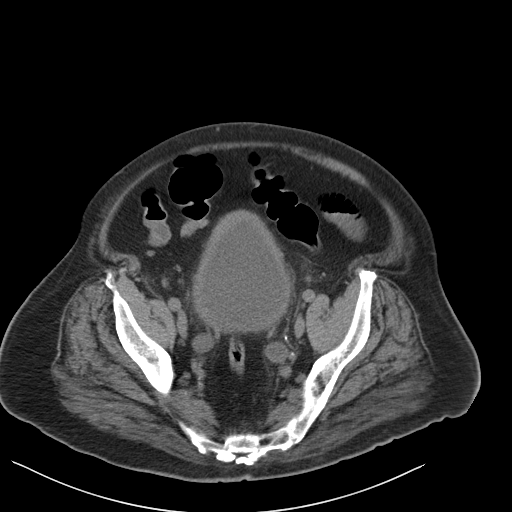
[im 31/88  soft-tissue]
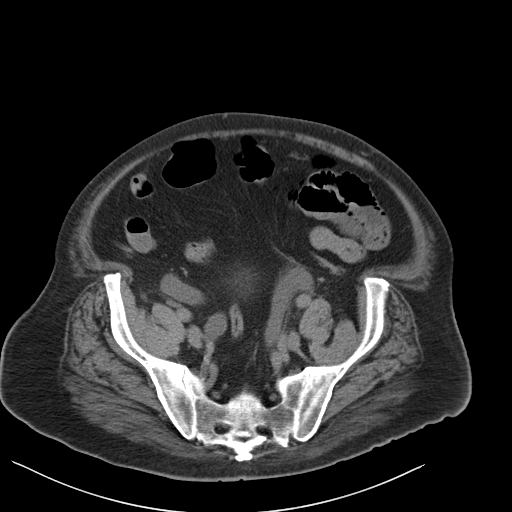
[im 40/88  soft-tissue]
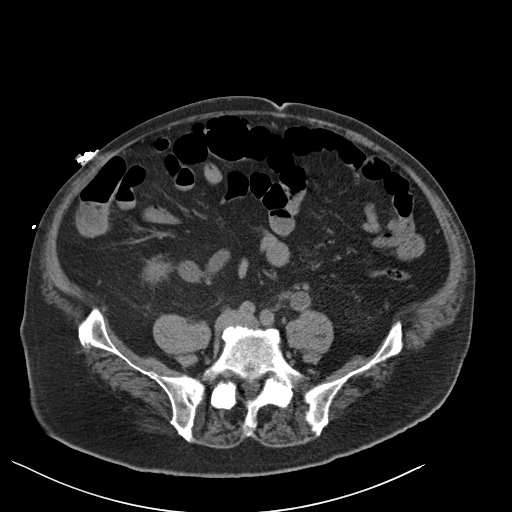
[im 44/88  soft-tissue]
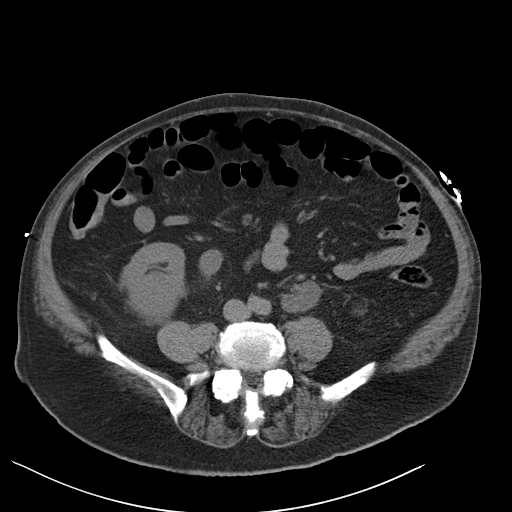
[im 48/88  soft-tissue]
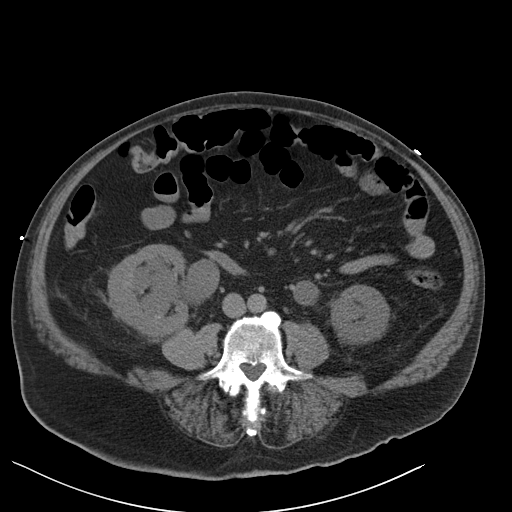
[im 57/88  soft-tissue]
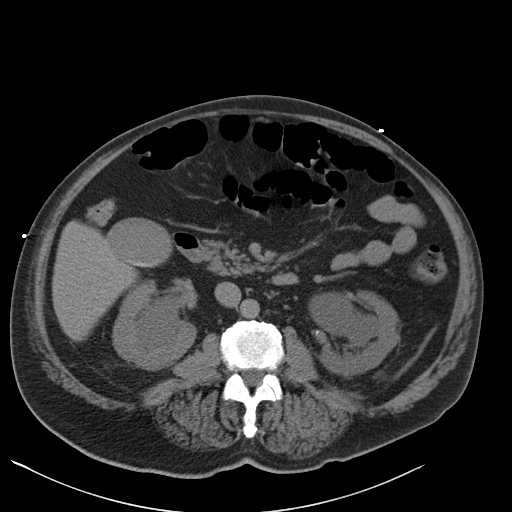
[im 57/88  bone]
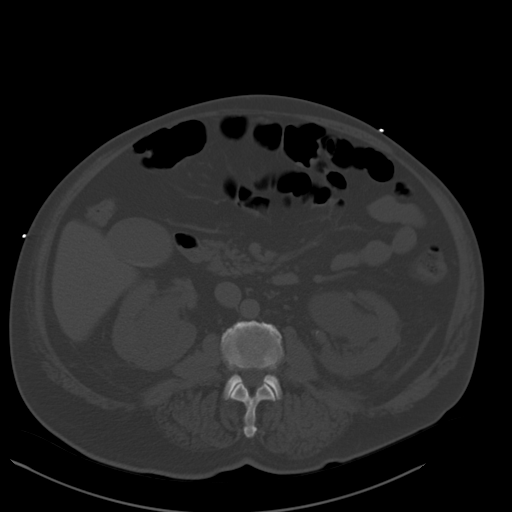
[im 61/88  soft-tissue]
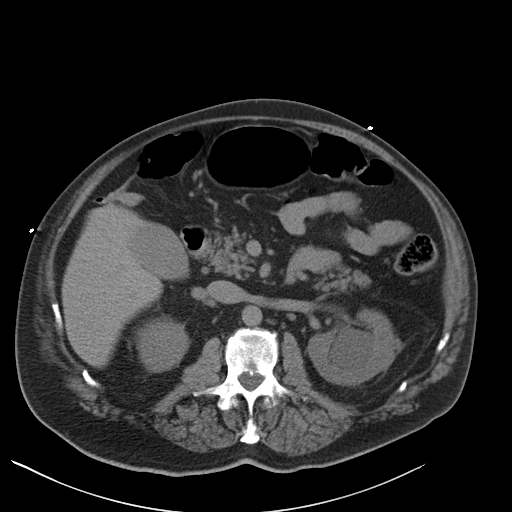
[im 70/88  soft-tissue]
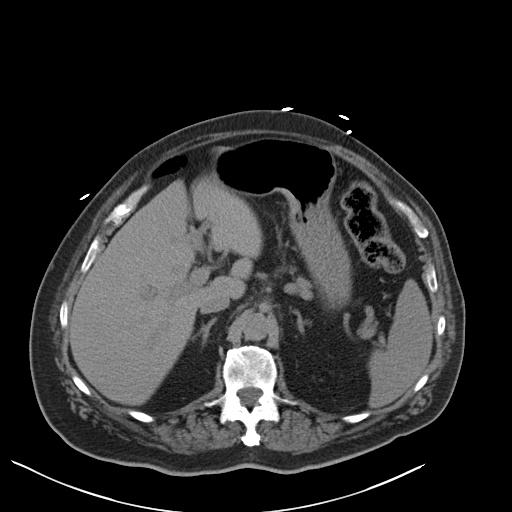
[im 74/88  soft-tissue]
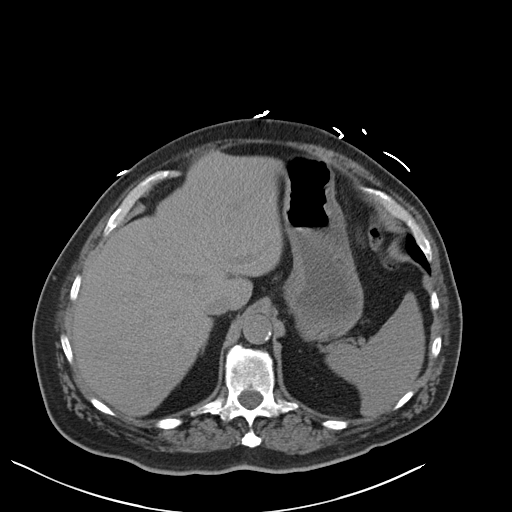
[im 83/88  soft-tissue]
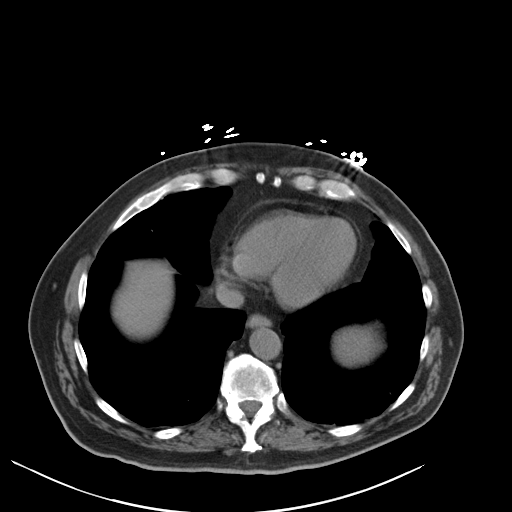

[Series 4: coronal · coronal · 0.77mm/px · 3 of 161 slices shown]
[im 54/161  soft-tissue]
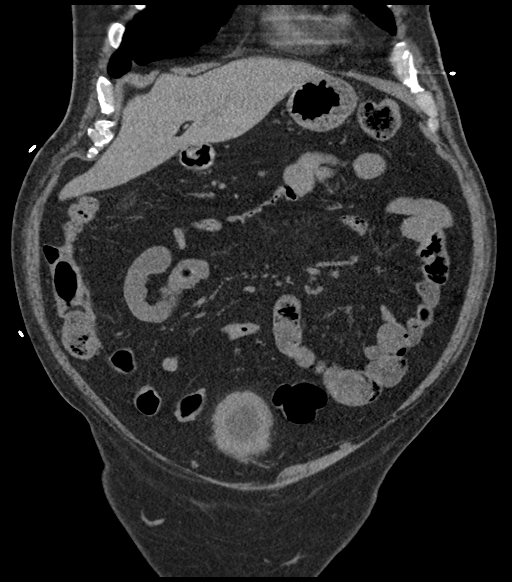
[im 72/161  soft-tissue]
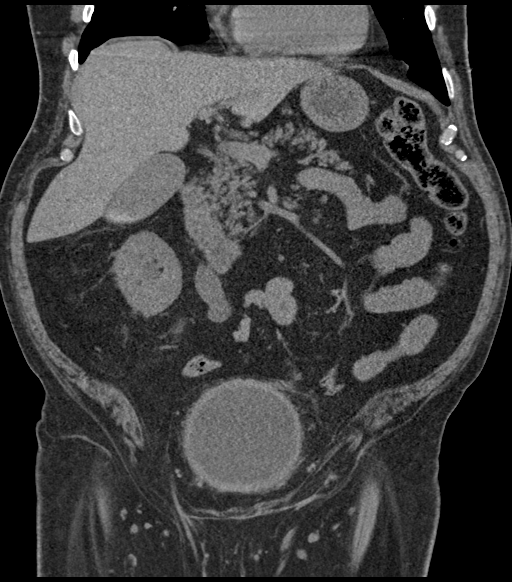
[im 89/161  soft-tissue]
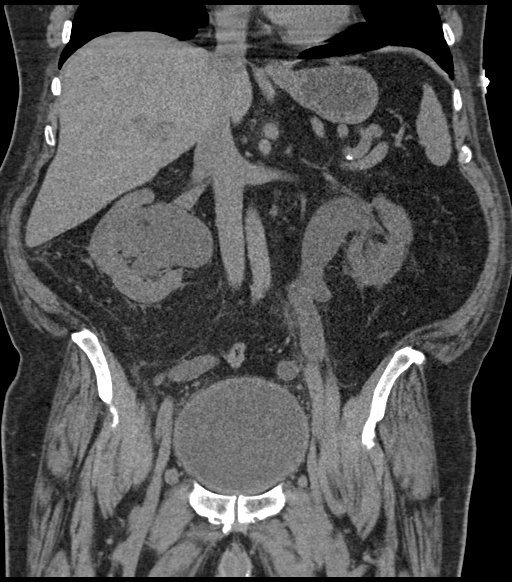

[16 of 46 positions shown; findings below may reference images not displayed]

FINDINGS: LOWER CHEST: There is no basilar pleural or apical pericardial
effusion.

HEPATOBILIARY: The hepatic contours and density are normal. There is
no intra- or extrahepatic biliary dilatation. The gallbladder is
normal.

PANCREAS: The pancreatic parenchymal contours are normal and there
is no ductal dilatation. There is no peripancreatic fluid
collection.

SPLEEN: Normal.

ADRENALS/URINARY TRACT:

--Adrenal glands: Normal.

--Right kidney/ureter: There is severe hydroureteronephrosis with
mild perinephric stranding. At the right ureteral orifice, there is
a large bladder calculus that measures up to 30 mm.

--Left kidney/ureter: There is severe left hydroureteronephrosis
with mild periureteral and perinephric stranding. No ureteral stone.

--Urinary bladder: 30 mm calculus within the urinary bladder near
the right ureteral orifice. There is diffuse bladder wall
thickening.

STOMACH/BOWEL:

--Stomach/Duodenum: There is no hiatal hernia or other gastric
abnormality. The duodenal course and caliber are normal.

--Small bowel: No dilatation or inflammation.

--Colon: No focal abnormality.

--Appendix: Normal.

VASCULAR/LYMPHATIC: Normal course and caliber of the major abdominal
vessels. No abdominal or pelvic lymphadenopathy.

REPRODUCTIVE: Enlarged prostate measures 7.3 cm in transverse
dimension.

MUSCULOSKELETAL. No bony spinal canal stenosis or focal osseous
abnormality.

OTHER: None.
IMPRESSION: 1. Severe bilateral hydroureteronephrosis with 3 cm bladder calculus
near the right ureteral orifice. No ureterolithiasis.
2. Enlarged prostate with diffuse bladder wall thickening, likely
indicating chronic bladder outlet obstruction.
